# Patient Record
Sex: Female | Born: 1960 | Race: Black or African American | Hispanic: No | Marital: Married | Smoking: Never smoker
Health system: Southern US, Community
[De-identification: ages and names within clinical notes are randomized; demographics above are authoritative.]

## PROBLEM LIST (undated history)

## (undated) DIAGNOSIS — E041 Nontoxic single thyroid nodule: Secondary | ICD-10-CM

## (undated) DIAGNOSIS — N809 Endometriosis, unspecified: Secondary | ICD-10-CM

## (undated) DIAGNOSIS — H409 Unspecified glaucoma: Secondary | ICD-10-CM

## (undated) DIAGNOSIS — D219 Benign neoplasm of connective and other soft tissue, unspecified: Secondary | ICD-10-CM

## (undated) DIAGNOSIS — M545 Low back pain, unspecified: Secondary | ICD-10-CM

## (undated) DIAGNOSIS — D649 Anemia, unspecified: Secondary | ICD-10-CM

## (undated) DIAGNOSIS — E042 Nontoxic multinodular goiter: Principal | ICD-10-CM

## (undated) DIAGNOSIS — M479 Spondylosis, unspecified: Secondary | ICD-10-CM

## (undated) HISTORY — DX: Spondylosis, unspecified: M47.9

## (undated) HISTORY — DX: Low back pain, unspecified: M54.50

## (undated) HISTORY — DX: Endometriosis, unspecified: N80.9

## (undated) HISTORY — PX: BREAST SURGERY: SHX581

## (undated) HISTORY — DX: Unspecified glaucoma: H40.9

## (undated) HISTORY — DX: Anemia, unspecified: D64.9

## (undated) HISTORY — DX: Nontoxic single thyroid nodule: E04.1

## (undated) HISTORY — DX: Benign neoplasm of connective and other soft tissue, unspecified: D21.9

## (undated) HISTORY — DX: Low back pain: M54.5

## (undated) HISTORY — DX: Nontoxic multinodular goiter: E04.2

## (undated) HISTORY — PX: CARPAL TUNNEL RELEASE: SHX101

---

## 1991-07-16 HISTORY — PX: TUBAL LIGATION: SHX77

## 2006-12-15 ENCOUNTER — Other Ambulatory Visit: Admission: RE | Admit: 2006-12-15 | Discharge: 2006-12-15 | Payer: Self-pay | Admitting: Obstetrics and Gynecology

## 2007-12-29 ENCOUNTER — Other Ambulatory Visit: Admission: RE | Admit: 2007-12-29 | Discharge: 2007-12-29 | Payer: Self-pay | Admitting: Obstetrics & Gynecology

## 2008-02-13 HISTORY — PX: ROTATOR CUFF REPAIR: SHX139

## 2010-03-13 LAB — HM DEXA SCAN: HM DEXA SCAN: NORMAL

## 2011-01-07 ENCOUNTER — Other Ambulatory Visit: Payer: Self-pay | Admitting: Obstetrics and Gynecology

## 2011-01-07 DIAGNOSIS — E049 Nontoxic goiter, unspecified: Secondary | ICD-10-CM

## 2011-01-09 ENCOUNTER — Ambulatory Visit
Admission: RE | Admit: 2011-01-09 | Discharge: 2011-01-09 | Disposition: A | Discharge status: Home / Self Care | Payer: 59 | Source: Ambulatory Visit | Attending: Obstetrics and Gynecology | Admitting: Obstetrics and Gynecology

## 2011-01-09 DIAGNOSIS — E049 Nontoxic goiter, unspecified: Secondary | ICD-10-CM

## 2011-03-16 HISTORY — PX: ABDOMINAL HYSTERECTOMY: SHX81

## 2011-07-31 ENCOUNTER — Other Ambulatory Visit: Payer: Self-pay | Admitting: Endocrinology

## 2011-07-31 DIAGNOSIS — E042 Nontoxic multinodular goiter: Secondary | ICD-10-CM

## 2012-01-20 ENCOUNTER — Other Ambulatory Visit: Payer: 59

## 2012-01-27 ENCOUNTER — Other Ambulatory Visit: Payer: 59

## 2012-01-27 ENCOUNTER — Ambulatory Visit
Admission: RE | Admit: 2012-01-27 | Discharge: 2012-01-27 | Disposition: A | Discharge status: Home / Self Care | Payer: 59 | Source: Ambulatory Visit | Attending: Endocrinology | Admitting: Endocrinology

## 2012-01-27 DIAGNOSIS — E042 Nontoxic multinodular goiter: Secondary | ICD-10-CM

## 2012-06-10 ENCOUNTER — Ambulatory Visit: Payer: 59 | Admitting: Licensed Clinical Social Worker

## 2012-12-22 ENCOUNTER — Other Ambulatory Visit: Payer: Self-pay | Admitting: Endocrinology

## 2012-12-22 DIAGNOSIS — E041 Nontoxic single thyroid nodule: Secondary | ICD-10-CM

## 2013-01-07 ENCOUNTER — Encounter: Payer: Self-pay | Admitting: *Deleted

## 2013-01-11 ENCOUNTER — Ambulatory Visit (INDEPENDENT_AMBULATORY_CARE_PROVIDER_SITE_OTHER): Payer: 59 | Admitting: Certified Nurse Midwife

## 2013-01-11 ENCOUNTER — Encounter: Payer: Self-pay | Admitting: Certified Nurse Midwife

## 2013-01-11 VITALS — BP 110/70 | HR 64 | Resp 16 | Ht 65.75 in | Wt 200.0 lb

## 2013-01-11 DIAGNOSIS — Z0000 Encounter for general adult medical examination without abnormal findings: Secondary | ICD-10-CM

## 2013-01-11 DIAGNOSIS — Z01419 Encounter for gynecological examination (general) (routine) without abnormal findings: Secondary | ICD-10-CM

## 2013-01-11 LAB — POCT URINALYSIS DIPSTICK
Nitrite, UA: NEGATIVE
Protein, UA: NEGATIVE
Urobilinogen, UA: NEGATIVE

## 2013-01-11 NOTE — Progress Notes (Signed)
52 y.o. G3P3 Married African American Fe here for annual exam. Menopausal no HRT for the past month per PCP recommendation and patient choice. Patient diagnosed with arthritis in back after evaluation by Endocrine, has referral to specialist. Sees PCP for aex and labs " all normal" per patient. Endocrine still following thyroid nodule, has [place] this year. Not on any medication for thyroid. No gyn health issues today. Sister passed last year from breast cancer.  No other family history of breast cancer.Patient doing well emotionally after she passed.   Patient's last menstrual period was 03/16/2011.          Sexually active: yes  The current method of family planning is status post hysterectomy.    Exercising: yes  walking Smoker:  no  Health Maintenance: Pap: 01-07-11 neg MMG:  5/14 neg. Colonoscopy: 2006 neg BMD:   8/11 TDaP:  2009 Labs: Poct urine- rbc-trace Self breast exams: done occ   reports that she has never smoked. She does not have any smokeless tobacco history on file. She reports that she drinks about 0.5 ounces of alcohol per week. She reports that she does not use illicit drugs.  Past Medical History  Diagnosis Date  . Anemia   . Thyroid nodule   . Endometriosis   . Fibroid     Past Surgical History  Procedure Laterality Date  . Breast surgery      biopsy  . Carpal tunnel release      both hands  . Tubal ligation  1993  . Rotator cuff repair Right 8/09  . Abdominal hysterectomy  9/12    [facility]    Current Outpatient Prescriptions  Medication Sig Dispense Refill  . Ascorbic Acid (VITAMIN C PO) Take by mouth as needed.      Marland Kitchen aspirin 81 MG tablet Take 81 mg by mouth daily. Take 2 a day      . B Complex Vitamins (B COMPLEX PO) Take by mouth daily.      Marland Kitchen CALCIUM PO Take by mouth daily.      . Cetirizine-Pseudoephedrine (ZYRTEC-D PO) Take by mouth daily.      . Cholecalciferol (VITAMIN D PO) Take by mouth daily.      . Coenzyme Q10 (CO Q 10 PO) Take by mouth daily.       . fluticasone (FLONASE) 50 MCG/ACT nasal spray daily.      Marland Kitchen MAGNESIUM PO Take by mouth daily.      . Multiple Vitamins-Minerals (MULTIVITAMIN PO) Take by mouth daily.      . Omega-3 Fatty Acids (FISH OIL PO) Take by mouth.      . Probiotic Product (PROBIOTIC PO) Take by mouth daily.       No current facility-administered medications for this visit.    Family History  Problem Relation Age of Onset  . Heart disease Mother   . Hypertension Mother   . Diabetes Mother   . Cancer Father     lung  . Breast cancer Sister   . Heart disease Brother   . Diabetes Brother     ROS:  Pertinent items are noted in HPI.  Otherwise, a comprehensive ROS was negative.  Exam:   BP 110/70  Pulse 64  Resp 16  Ht 5' 5.75" (1.67 m)  Wt 200 lb (90.719 kg)  BMI 32.53 kg/m2  LMP 03/16/2011 Height: 5' 5.75" (167 cm)  Ht Readings from Last 3 Encounters:  01/11/13 5' 5.75" (1.67 m)    General appearance: alert,  cooperative and appears stated age Head: Normocephalic, without obvious abnormality, atraumatic Neck: no adenopathy, supple, symmetrical, trachea midline and thyroid  enlarged and no nodule palpated today Lungs: clear to auscultation bilaterally Breasts: normal appearance, no masses or tenderness, No nipple retraction or dimpling, No nipple discharge or bleeding, No axillary or supraclavicular adenopathy Heart: regular rate and rhythm Abdomen: soft, non-tender; no masses,  no organomegaly Extremities: extremities normal, atraumatic, no cyanosis or edema Skin: Skin color, texture, turgor normal. No rashes or lesions Lymph nodes: Cervical, supraclavicular, and axillary nodes normal. No abnormal inguinal nodes palpated Neurologic: Grossly normal   Pelvic: External genitalia:  no lesions              Urethra:  normal appearing urethra with no masses, tenderness or lesions              Bartholin's and Skene's: normal                 Vagina: normal appearing vagina with normal color and  discharge, no lesions              Cervix: absent              Pap taken: no Bimanual Exam:  Uterus:  uterus absent              Adnexa: no mass, fullness, tenderness and ovaries absent               Rectovaginal: Confirms               Anus:  normal sphincter tone, no lesions  A:  Well Woman with normal exam  S/P [facility] no HRT now patient choice and PCP recommended.  Known enlarged thyroid with evaluation, no size change, being followed by endocrine  Family history of breast cancer sister  Arthritis under evaluation  P:  Reviewed health and wellness pertinent to exam  Discussed may notice occasional hot flashes after stopping HRT, if concern notify.  Continue follow up with endocrine as indicated.  Stressed SBE, mammogram yearly   Pap smear as per guidelines   Mammogram yearly or as indicated pap smear not taken today  counseled on breast self exam, mammography screening, feminine hygiene, menopause, adequate intake of calcium and vitamin D, diet and exercise, Kegel's exercises  return annually or prn  An After Visit Summary was printed and given to the patient.

## 2013-01-11 NOTE — Patient Instructions (Addendum)

## 2013-01-12 NOTE — Progress Notes (Signed)
Reviewed CPRomine, MD 

## 2013-01-29 ENCOUNTER — Ambulatory Visit
Admission: RE | Admit: 2013-01-29 | Discharge: 2013-01-29 | Disposition: A | Discharge status: Home / Self Care | Payer: 59 | Source: Ambulatory Visit | Attending: Endocrinology | Admitting: Endocrinology

## 2013-01-29 DIAGNOSIS — E041 Nontoxic single thyroid nodule: Secondary | ICD-10-CM

## 2013-02-15 ENCOUNTER — Other Ambulatory Visit: Payer: 59

## 2013-03-08 ENCOUNTER — Encounter: Payer: Self-pay | Admitting: Neurology

## 2013-03-08 ENCOUNTER — Ambulatory Visit (INDEPENDENT_AMBULATORY_CARE_PROVIDER_SITE_OTHER): Payer: 59 | Admitting: Neurology

## 2013-03-08 VITALS — BP 141/89 | HR 67 | Ht 66.0 in | Wt 200.0 lb

## 2013-03-08 DIAGNOSIS — M545 Low back pain: Secondary | ICD-10-CM

## 2013-03-08 MED ORDER — MELOXICAM 15 MG PO TABS: 15.0000 mg | ORAL_TABLET | Freq: Every day | ORAL | Status: DC

## 2013-03-08 NOTE — Progress Notes (Signed)
Reason for visit: Low back pain  Courtney Roberson is a 52 y.o. female  History of present illness:  Courtney Roberson is a 52 year old right-handed black female with a history of low back pain that dates back approximately one year. The patient indicates that she has some pain in the back, more to the left side than the right. The pain will go down the legs to the feet at times, and the patient will also note some numbness that goes down to the feet as well. The patient does not have actual weakness of the legs. The patient denies any pain in the neck or shoulders. The patient has not had any balance issues, and she denies any falls. The patient denies problems controlling the bowels or the bladder. The patient indicates that if she is in one position too long such as sitting, standing, or lying down, she will have increased back pain. The patient feels better if she changes positions frequently. The patient has been trying to lose weight and be active, and she has noted that her back pain worsened when she was using the elliptical machine. The patient has undergone MRI evaluation of the low back that shows some facet joint arthritis at the L5-S1 level, but there is no evidence of nerve root compression at any level. The patient is sent to this office for further evaluation. The patient previously was placed on a prednisone taper, and she gained good improvement with the low back pain on this regimen.  Past Medical History  Diagnosis Date  . Anemia   . Thyroid nodule   . Endometriosis   . Fibroid   . Lumbago   . Glaucoma     bilateral    Past Surgical History  Procedure Laterality Date  . Breast surgery      biopsy benign  . Carpal tunnel release      both hands  . Tubal ligation  1993  . Rotator cuff repair Right 8/09  . Abdominal hysterectomy  9/12    [facility]    Family History  Problem Relation Age of Onset  . Heart disease Mother   . Hypertension Mother   . Diabetes Mother   . Cancer Father      lung  . Breast cancer Sister   . Heart disease Brother   . Diabetes Brother     Social history:  reports that she has never smoked. She does not have any smokeless tobacco history on file. She reports that she drinks about 0.5 ounces of alcohol per week. She reports that she does not use illicit drugs.  Medications:  Current Outpatient Prescriptions on File Prior to Visit  Medication Sig Dispense Refill  . aspirin 81 MG tablet Take 81 mg by mouth daily. Take 2 a day      . B Complex Vitamins (B COMPLEX PO) Take 1 tablet by mouth daily.       . fluticasone (FLONASE) 50 MCG/ACT nasal spray 2 sprays daily.       . Multiple Vitamins-Minerals (MULTIVITAMIN PO) Take by mouth daily.      . Probiotic Product (PROBIOTIC PO) Take by mouth daily.       No current facility-administered medications on file prior to visit.      Allergies  Allergen Reactions  . Betadine [Povidone Iodine] Swelling  . Penicillins Rash    ROS:  Out of a complete 14 system review of symptoms, the patient complains only of the following symptoms, and all other  reviewed systems are negative.  Weight gain, fatigue Chest pain, swelling in the legs Snoring Constipation Easy bruising Feeling hot, cold Joint pain, joint swelling Allergies, runny nose, skin sensitivity Memory loss Sleepiness, restless legs  Blood pressure 141/89, pulse 67, height 5\' 6"  (1.676 m), weight 200 lb (90.719 kg), last menstrual period 03/16/2011.  Physical Exam  General: The patient is alert and cooperative at the time of the examination. The patient is minimally obese.  Head: Pupils are equal, round, and reactive to light. Discs are flat bilaterally.  Neck: The neck is supple, no carotid bruits are noted.  Respiratory: The respiratory examination is clear.  Cardiovascular: The cardiovascular examination reveals a regular rate and rhythm, no obvious murmurs or rubs are noted.  Neuromuscular: The patient has excellent range  of motion of the low back. Minimal discomfort to palpation of the low back is noted.  Skin: Extremities are without significant edema.  Neurologic Exam  Mental status:  Cranial nerves: Facial symmetry is present. There is good sensation of the face to pinprick and soft touch bilaterally. The strength of the facial muscles and the muscles to head turning and shoulder shrug are normal bilaterally. Speech is well enunciated, no aphasia or dysarthria is noted. Extraocular movements are full. Visual fields are full.  Motor: The motor testing reveals 5 over 5 strength of all 4 extremities. Good symmetric motor tone is noted throughout. The patient is able to walk on heels and the toes.  Sensory: Sensory testing is intact to pinprick, soft touch, vibration sensation, and position sense on all 4 extremities. No evidence of extinction is noted.  Coordination: Cerebellar testing reveals good finger-nose-finger and heel-to-shin bilaterally.  Gait and station: Gait is normal. Tandem gait is normal. Romberg is negative. No drift is seen.  Reflexes: Deep tendon reflexes are symmetric and normal bilaterally, with the exception that the ankle jerk reflexes are depressed bilaterally. Toes are downgoing bilaterally.   Assessment/Plan:  1. Chronic low back pain  The patient has some facet joint arthritis that is the likely etiology for discomfort. The patient will be placed on Mobic, and she will continue exercising using yoga. The patient may consider chiropractic manipulations in the future. The patient will followup through this office in 4 or 5 months. The MRI of the lumbosacral spine does not show any surgical issues.  Courtney Palau MD 03/08/2013 7:26 PM  [facility] [address] [place], [place] [address]  Phone [phone] Fax [phone]

## 2013-03-26 ENCOUNTER — Telehealth: Payer: Self-pay

## 2013-03-26 LAB — FECAL OCCULT BLOOD, IMMUNOCHEMICAL: Fecal Occult Blood: NEGATIVE

## 2013-03-26 NOTE — Telephone Encounter (Signed)
IFOB neg LMTCB

## 2013-03-29 NOTE — Telephone Encounter (Signed)
Patient is returning Joy's call about results.

## 2013-03-29 NOTE — Telephone Encounter (Signed)
Patient notified of results.

## 2013-06-22 ENCOUNTER — Other Ambulatory Visit: Payer: Self-pay | Admitting: Endocrinology

## 2013-06-22 DIAGNOSIS — E041 Nontoxic single thyroid nodule: Secondary | ICD-10-CM

## 2013-07-09 ENCOUNTER — Encounter (INDEPENDENT_AMBULATORY_CARE_PROVIDER_SITE_OTHER): Payer: Self-pay

## 2013-07-09 ENCOUNTER — Encounter: Payer: Self-pay | Admitting: Nurse Practitioner

## 2013-07-09 ENCOUNTER — Ambulatory Visit (INDEPENDENT_AMBULATORY_CARE_PROVIDER_SITE_OTHER): Payer: 59 | Admitting: Nurse Practitioner

## 2013-07-09 VITALS — BP 126/85 | HR 68 | Ht 66.0 in | Wt 207.0 lb

## 2013-07-09 DIAGNOSIS — M545 Low back pain: Secondary | ICD-10-CM

## 2013-07-09 MED ORDER — MELOXICAM 15 MG PO TABS: 15.0000 mg | ORAL_TABLET | Freq: Every day | ORAL | Status: DC

## 2013-07-09 NOTE — Progress Notes (Signed)
I have read the note, and I agree with the clinical assessment and plan.  Chyane Greer KEITH   

## 2013-07-09 NOTE — Patient Instructions (Signed)
Given back exercises to perform several times daily Continue Yoga F/U in 6 months

## 2013-07-09 NOTE — Progress Notes (Signed)
[facility]  PATIENT: Courtney Roberson DOB: 1961-06-14   REASON FOR VISIT: Followup for low back pain   HISTORY OF PRESENT ILLNESS:Courtney Roberson, 52 -year-old female returns for followup. She continues to have intermittent low back pain. She is currently not taking Mobic, only taking it on a when necessary basis. She is continuing her yoga. Her MRI of the lumbar spine shows facet joint arthritis at L5-S1 but no nerve root compression. She is trying to lose weight and be active. She is not doing any specific back exercises.   HISTORY: of low back pain that dates back approximately one year. The patient indicates that she has some pain in the back, more to the left side than the right. The pain will go down the legs to the feet at times, and the patient will also note some numbness that goes down to the feet as well. The patient does not have actual weakness of the legs. The patient denies any pain in the neck or shoulders. The patient has not had any balance issues, and she denies any falls. The patient denies problems controlling the bowels or the bladder. The patient indicates that if she is in one position too long such as sitting, standing, or lying down, she will have increased back pain. The patient feels better if she changes positions frequently. The patient has been trying to lose weight and be active, and she has noted that her back pain worsened when she was using the elliptical machine. The patient has undergone MRI evaluation of the low back that shows some facet joint arthritis at the L5-S1 level, but there is no evidence of nerve root compression at any level. The patient is sent to this office for further evaluation. The patient previously was placed on a prednisone taper, and she gained good improvement with the low back pain on this regimen   REVIEW OF SYSTEMS: Full 14 system review of systems performed and notable only for those listed, all others are neg:    Constitutional: fatigue  Cardiovascular: N/A  Ear/Nose/Throat: N/A  Skin: N/A  Eyes: N/A  Respiratory: N/A  Gastroitestinal: N/A  Hematology/Lymphatic: N/A  Endocrine: N/A Musculoskeletal: Back pain, joint pain Allergy/Immunology: Environmental allergies  Neurological: N/A Psychiatric: N/A   ALLERGIES: Allergies  Allergen Reactions  . Betadine [Povidone Iodine] Swelling  . Penicillins Rash    HOME MEDICATIONS: Outpatient Prescriptions Prior to Visit  Medication Sig Dispense Refill  . aspirin 81 MG tablet Take 81 mg by mouth daily. Take 2 a day      . B Complex Vitamins (B COMPLEX PO) Take 1 tablet by mouth daily.       . Calcium Carbonate-Vitamin D (CALTRATE 600+D) 600-400 MG-UNIT per chew tablet Chew 1 tablet by mouth daily.      . cetirizine (ZYRTEC) 10 MG tablet Take 10 mg by mouth daily.      . cholecalciferol (VITAMIN D) 1000 UNITS tablet Take 1,000 Units by mouth daily.      . Coenzyme Q10 100 MG capsule Take 100 mg by mouth daily.      . dorzolamide-timolol (COSOPT) 22.3-6.8 MG/ML ophthalmic solution Place 1 drop into both eyes 2 (two) times daily.       . fluticasone (FLONASE) 50 MCG/ACT nasal spray 2 sprays daily.       . Magnesium Oxide 250 MG TABS Take 250 mg by mouth daily.      . meloxicam (MOBIC) 15 MG tablet Take 1 tablet (15 mg total)  by mouth daily.  30 tablet  2  . Multiple Vitamins-Minerals (MULTIVITAMIN PO) Take by mouth daily.      . Omega-3 Fatty Acids (FISH OIL) 1000 MG CAPS Take 1,000 mg by mouth daily.      . Probiotic Product (PROBIOTIC PO) Take by mouth daily.      . timolol (BETIMOL) 0.5 % ophthalmic solution Place 1 drop into both eyes daily.      . vitamin C (ASCORBIC ACID) 500 MG tablet Take 500 mg by mouth daily.       No facility-administered medications prior to visit.    PAST MEDICAL HISTORY: Past Medical History  Diagnosis Date  . Anemia   . Thyroid nodule   . Endometriosis   . Fibroid   . Lumbago   . Glaucoma     bilateral     PAST SURGICAL HISTORY: Past Surgical History  Procedure Laterality Date  . Breast surgery      biopsy benign  . Carpal tunnel release      both hands  . Tubal ligation  1993  . Rotator cuff repair Right 8/09  . Abdominal hysterectomy  9/12    [facility]    FAMILY HISTORY: Family History  Problem Relation Age of Onset  . Heart disease Mother   . Hypertension Mother   . Diabetes Mother   . Cancer Father     lung  . Breast cancer Sister   . Heart disease Brother   . Diabetes Brother     SOCIAL HISTORY: History   Social History  . Marital Status: Married    Spouse Name: N/A    Number of Children: 3  . Years of Education: 16   Occupational History  . Not on file.   Social History Main Topics  . Smoking status: Never Smoker   . Smokeless tobacco: Never Used  . Alcohol Use: 0.5 oz/week    1 drink(s) per week     Comment: occ.  . Drug Use: No  . Sexual Activity: Yes    Partners: Male    Birth Control/ Protection: Surgical     Comment: [facility]   Other Topics Concern  . Not on file   Social History Narrative   Patient is married Fayrene Fearing) and lives at home with her spouse.   Patient has three children.   Patient is retired.   Patient has an Associate's Degree.   Patient is right- handed.   Patient drinks tea- one cup a day, once a week for coffee and sodas once in a while.     PHYSICAL EXAM  Filed Vitals:   07/09/13 1413  BP: 126/85  Pulse: 68  Height: 5\' 6"  (1.676 m)  Weight: 207 lb (93.895 kg)   Body mass index is 33.43 kg/(m^2).  Generalized: Well developed, minimally obese female in no acute distress  Head: normocephalic and atraumatic,. Oropharynx benign  Neck: Supple, no carotid bruits  Cardiac: Regular rate rhythm, no murmur  Musculoskeletal: No deformity   Neurological examination   Mentation: Alert oriented to time, place, history taking. Follows all commands speech and language fluent  Cranial nerve II-XII: .Pupils were equal round reactive  to light extraocular movements were full, visual field were full on confrontational test. Facial sensation and strength were normal. hearing was intact to finger rubbing bilaterally. Uvula tongue midline. head turning and shoulder shrug were normal and symmetric.Tongue protrusion into cheek strength was normal. Motor: normal bulk and tone, full strength in the BUE, BLE, fine  finger movements normal, no pronator drift. No focal weakness Sensory: normal and symmetric to light touch, pinprick, and  vibration  Coordination: finger-nose-finger, heel-to-shin bilaterally, no dysmetria Reflexes: Brachioradialis 2/2, biceps 2/2, triceps 2/2, patellar 2/2, Achilles 1/1, plantar responses were flexor bilaterally. Gait and Station: Rising up from seated position without assistance, normal stance,  moderate stride, good arm swing, smooth turning, able to perform tiptoe, and heel walking without difficulty. Tandem gait is steady  DIAGNOSTIC DATA (LABS, IMAGING, TESTING) -None to review  ASSESSMENT AND PLAN  52 y.o. year old female  has a past medical history of Anemia;  Lumbago; to followup. She is only taking her Mobic when necessary  Given back exercises to perform several times daily Continue Yoga Mobic daily as presacribed F/U in 6 months Nilda Riggs, [facility], [facility], APRN  [facility] [address] [place], [place] [address] [phone]

## 2013-12-01 LAB — LIPID PANEL
CHOLESTEROL: 195 mg/dL (ref 0–200)
HDL: 74 mg/dL — AB (ref 35–70)
LDL Cholesterol: 118 mg/dL
LDl/HDL Ratio: 2.6
Triglycerides: 69 mg/dL (ref 40–160)

## 2013-12-01 LAB — BASIC METABOLIC PANEL
BUN: 13 mg/dL (ref 4–21)
CREATININE: 1 mg/dL (ref <=1.1)
GLUCOSE: 95 mg/dL
POTASSIUM: 5.2 mmol/L (ref 3.4–5.3)
Sodium: 142 mmol/L (ref 137–147)

## 2013-12-01 LAB — CBC AND DIFFERENTIAL
HCT: 38 % (ref 36–46)
HEMOGLOBIN: 12.4 g/dL (ref 12.0–16.0)
Platelets: 246 10*3/uL (ref 150–399)
WBC: 5.7 10*3/mL

## 2013-12-01 LAB — HEPATIC FUNCTION PANEL
ALK PHOS: 69 U/L (ref 25–125)
ALT: 13 U/L (ref 7–35)
AST: 18 U/L (ref 13–35)
Bilirubin, Total: 0.9 mg/dL

## 2013-12-20 ENCOUNTER — Ambulatory Visit: Payer: 59 | Admitting: Nurse Practitioner

## 2014-01-12 ENCOUNTER — Encounter: Payer: Self-pay | Admitting: Certified Nurse Midwife

## 2014-01-12 ENCOUNTER — Ambulatory Visit (INDEPENDENT_AMBULATORY_CARE_PROVIDER_SITE_OTHER): Payer: 59 | Admitting: Certified Nurse Midwife

## 2014-01-12 VITALS — BP 110/64 | HR 68 | Resp 16 | Ht 65.75 in | Wt 202.0 lb

## 2014-01-12 DIAGNOSIS — Z0000 Encounter for general adult medical examination without abnormal findings: Secondary | ICD-10-CM

## 2014-01-12 DIAGNOSIS — Z1211 Encounter for screening for malignant neoplasm of colon: Secondary | ICD-10-CM

## 2014-01-12 DIAGNOSIS — Z01419 Encounter for gynecological examination (general) (routine) without abnormal findings: Secondary | ICD-10-CM

## 2014-01-12 LAB — POCT URINALYSIS DIPSTICK
BILIRUBIN UA: NEGATIVE
Glucose, UA: NEGATIVE
KETONES UA: NEGATIVE
LEUKOCYTES UA: NEGATIVE
Nitrite, UA: NEGATIVE
PH UA: 5
PROTEIN UA: NEGATIVE
Urobilinogen, UA: NEGATIVE

## 2014-01-12 NOTE — Patient Instructions (Signed)

## 2014-01-12 NOTE — Progress Notes (Signed)
53 y.o. G3P3 Married African American Fe here for annual exam. Menopausal no HRT, just occasional hot flashes. No vaginal dryness issues. Sees PCP for aex, labs and medication management.Has appointment in July for follow up of thyroid nodule, no change with last [place]. Staying busy with exercise and family. No other health issues today.   Patient's last menstrual period was 03/16/2011.          Sexually active: Yes.    The current method of family planning is status post hysterectomy.    Exercising: Yes.    yoga, low impact walking Smoker:  no  Health Maintenance: Pap:  01-07-11 neg MMG: 11-26-13 density category c, bi-rads category 2: benign Colonoscopy:  2006 BMD:   8/11 TDaP:  2009 Labs: Poct urine-rbc tr Self breast exam: done occ   reports that she has never smoked. She has never used smokeless tobacco. She reports that she drinks alcohol. She reports that she does not use illicit drugs.  Past Medical History  Diagnosis Date  . Anemia   . Thyroid nodule   . Endometriosis   . Fibroid   . Lumbago   . Glaucoma     bilateral  . Arthritis of back     Past Surgical History  Procedure Laterality Date  . Breast surgery      biopsy benign  . Carpal tunnel release      both hands  . Tubal ligation  1993  . Rotator cuff repair Right 8/09  . Abdominal hysterectomy  9/12    [facility]    Current Outpatient Prescriptions  Medication Sig Dispense Refill  . aspirin 81 MG tablet Take 81 mg by mouth daily. Take 2 a day      . B Complex Vitamins (B COMPLEX PO) Take 1 tablet by mouth daily.       . Calcium Carbonate-Vitamin D (CALTRATE 600+D) 600-400 MG-UNIT per chew tablet Chew 1 tablet by mouth daily.      . cetirizine (ZYRTEC) 10 MG tablet Take 10 mg by mouth daily.      . cholecalciferol (VITAMIN D) 1000 UNITS tablet Take 1,000 Units by mouth daily.      . Coenzyme Q10 100 MG capsule Take 100 mg by mouth daily.      . dorzolamide-timolol (COSOPT) 22.3-6.8 MG/ML ophthalmic solution Place  1 drop into both eyes 2 (two) times daily.       . fluticasone (FLONASE) 50 MCG/ACT nasal spray 2 sprays daily.       . Magnesium Oxide 250 MG TABS Take 250 mg by mouth daily.      . Multiple Vitamins-Minerals (MULTIVITAMIN PO) Take by mouth daily.      . Omega-3 Fatty Acids (FISH OIL) 1000 MG CAPS Take 1,000 mg by mouth daily.      . Probiotic Product (PROBIOTIC PO) Take by mouth as needed.       . timolol (BETIMOL) 0.5 % ophthalmic solution Place 1 drop into both eyes daily.      . vitamin C (ASCORBIC ACID) 500 MG tablet Take 500 mg by mouth as needed.        No current facility-administered medications for this visit.    Family History  Problem Relation Age of Onset  . Heart disease Mother   . Hypertension Mother   . Diabetes Mother   . Cancer Father     lung  . Breast cancer Sister   . Heart disease Brother   . Diabetes Brother  ROS:  Pertinent items are noted in HPI.  Otherwise, a comprehensive ROS was negative.  Exam:   BP 110/64  Pulse 68  Resp 16  Ht 5' 5.75" (1.67 m)  Wt 202 lb (91.627 kg)  BMI 32.85 kg/m2  LMP 03/16/2011 Height: 5' 5.75" (167 cm)  Ht Readings from Last 3 Encounters:  01/12/14 5' 5.75" (1.67 m)  07/09/13 5\' 6"  (1.676 m)  03/08/13 5\' 6"  (1.676 m)    General appearance: alert, cooperative and appears stated age Head: Normocephalic, without obvious abnormality, atraumatic Neck: no adenopathy, supple, symmetrical, trachea midline and thyroidenlarged to inspection and palpation, nodular and known nodules Lungs: clear to auscultation bilaterally Breasts: normal appearance, no masses or tenderness, No nipple retraction or dimpling, No nipple discharge or bleeding, No axillary or supraclavicular adenopathy Heart: regular rate and rhythm Abdomen: soft, non-tender; no masses,  no organomegaly Extremities: extremities normal, atraumatic, no cyanosis or edema Skin: Skin color, texture, turgor normal. No rashes or lesions Lymph nodes: Cervical,  supraclavicular, and axillary nodes normal. No abnormal inguinal nodes palpated Neurologic: Grossly normal   Pelvic: External genitalia:  no lesions              Urethra:  normal appearing urethra with no masses, tenderness or lesions              Bartholin's and Skene's: normal                 Vagina: normal appearing vagina with normal color and discharge, no lesions              Cervix: absent              Pap taken: No. Bimanual Exam:  Uterus:  uterus absent              Adnexa: no mass, fullness, tenderness               Rectovaginal: Confirms               Anus:  normal sphincter tone, no lesions  A:  Well Woman with normal exam  Menopausal no HRT, [facility] endometrosis  Enlarged thyroid with nodules under Endocrine management  History of microscopic hematuria with negative urology work up faint trace of RBC, no micro sent    P:   Reviewed health and wellness pertinent to exam  Continue follow up as indicated with MD  Pap smear not taken today  counseled on breast self exam, mammography screening, adequate intake of calcium and vitamin D, diet and exercise. IFOB dispensed  return annually or prn  An After Visit Summary was printed and given to the patient.

## 2014-01-17 NOTE — Progress Notes (Signed)
Reviewed personally.  M. Suzanne Velencia Lenart, MD.  

## 2014-01-24 ENCOUNTER — Ambulatory Visit
Admission: RE | Admit: 2014-01-24 | Discharge: 2014-01-24 | Disposition: A | Discharge status: Home / Self Care | Payer: 59 | Source: Ambulatory Visit | Attending: Endocrinology | Admitting: Endocrinology

## 2014-01-24 DIAGNOSIS — E041 Nontoxic single thyroid nodule: Secondary | ICD-10-CM

## 2014-02-07 ENCOUNTER — Telehealth: Payer: Self-pay | Admitting: *Deleted

## 2014-02-07 NOTE — Telephone Encounter (Signed)
Patient called back and was r/s to 05/10/14 at 11:30 am.

## 2014-02-07 NOTE — Telephone Encounter (Signed)
Called patient on both home and mobile phone, lvm on mobile phone to return the call to r/s appointment.

## 2014-02-10 ENCOUNTER — Ambulatory Visit: Payer: Self-pay | Admitting: Nurse Practitioner

## 2014-02-14 ENCOUNTER — Other Ambulatory Visit: Payer: Self-pay | Admitting: Endocrinology

## 2014-02-14 DIAGNOSIS — E041 Nontoxic single thyroid nodule: Secondary | ICD-10-CM

## 2014-02-16 ENCOUNTER — Ambulatory Visit: Payer: Self-pay | Admitting: Nurse Practitioner

## 2014-02-25 LAB — FECAL OCCULT BLOOD, IMMUNOCHEMICAL: IMMUNOLOGICAL FECAL OCCULT BLOOD TEST: NEGATIVE

## 2014-02-25 NOTE — Addendum Note (Signed)
Addended by: Graylon Good on: 02/25/2014 10:32 AM   Modules accepted: Orders

## 2014-04-18 ENCOUNTER — Telehealth: Payer: Self-pay | Admitting: *Deleted

## 2014-04-18 NOTE — Telephone Encounter (Signed)
Calling patient to r/s appointment on 05/10/14, left message to call back and r/s appointment.

## 2014-05-10 ENCOUNTER — Ambulatory Visit: Payer: Self-pay | Admitting: Nurse Practitioner

## 2014-05-16 ENCOUNTER — Encounter: Payer: Self-pay | Admitting: Certified Nurse Midwife

## 2014-05-17 ENCOUNTER — Encounter: Payer: Self-pay | Admitting: Nurse Practitioner

## 2014-05-17 ENCOUNTER — Ambulatory Visit (INDEPENDENT_AMBULATORY_CARE_PROVIDER_SITE_OTHER): Payer: 59 | Admitting: Nurse Practitioner

## 2014-05-17 VITALS — BP 120/75 | HR 65 | Ht 66.0 in | Wt 208.0 lb

## 2014-05-17 DIAGNOSIS — M545 Low back pain: Secondary | ICD-10-CM

## 2014-05-17 NOTE — Progress Notes (Signed)
I have read the note, and I agree with the clinical assessment and plan.  Shine Scrogham KEITH   

## 2014-05-17 NOTE — Patient Instructions (Addendum)
Continue  back exercises to perform several times daily Continue Yoga Water aerobics  F/U in 6 months

## 2014-05-17 NOTE — Progress Notes (Signed)
[facility]  PATIENT: Courtney Roberson DOB: 09-06-1960   REASON FOR VISIT: follow up for back pain   HISTORY OF PRESENT ILLNESS:Courtney Roberson, 53 -year-old female returns for followup. She continues to have intermittent low back pain. She is currently  taking Aleve,  only taking it on a when necessary basis for back pain.  She is continuing her yoga. Her MRI of the lumbar spine shows facet joint arthritis at L5-S1 but no nerve root compression. She is trying to lose weight and be active. She is  doing  specific back exercises.She feels like she is stable. She returns for reevaluation   HISTORY: of low back pain that dates back approximately one year. The patient indicates that she has some pain in the back, more to the left side than the right. The pain will go down the legs to the feet at times, and the patient will also note some numbness that goes down to the feet as well. The patient does not have actual weakness of the legs. The patient denies any pain in the neck or shoulders. The patient has not had any balance issues, and she denies any falls. The patient denies problems controlling the bowels or the bladder. The patient indicates that if she is in one position too long such as sitting, standing, or lying down, she will have increased back pain. The patient feels better if she changes positions frequently. The patient has been trying to lose weight and be active, and she has noted that her back pain worsened when she was using the elliptical machine. The patient has undergone MRI evaluation of the low back that shows some facet joint arthritis at the L5-S1 level, but there is no evidence of nerve root compression at any level. The patient is sent to this office for further evaluation. The patient previously was placed on a prednisone taper, and she gained good improvement with the low back pain on this regimen   REVIEW OF SYSTEMS: Full 14 system review of systems performed and  notable only for those listed, all others are neg:  Constitutional: N/A  Cardiovascular: N/A  Ear/Nose/Throat: N/A  Skin: N/A  Eyes: light sensitivity Respiratory: N/A  Gastroitestinal: N/A  Hematology/Lymphatic: N/A  Endocrine: N/A Musculoskeletal:joint pain, Occasional back pain Allergy/Immunology: environmental allergies  Neurological: N/A Psychiatric: N/A Sleep : NA   ALLERGIES: Allergies  Allergen Reactions  . Betadine [Povidone Iodine] Swelling  . Penicillins Rash    HOME MEDICATIONS: Outpatient Prescriptions Prior to Visit  Medication Sig Dispense Refill  . aspirin 81 MG tablet Take 81 mg by mouth daily. Take 2 a day    . B Complex Vitamins (B COMPLEX PO) Take 1 tablet by mouth daily.     . Calcium Carbonate-Vitamin D (CALTRATE 600+D) 600-400 MG-UNIT per chew tablet Chew 1 tablet by mouth daily.    . cetirizine (ZYRTEC) 10 MG tablet Take 10 mg by mouth daily.    . cholecalciferol (VITAMIN D) 1000 UNITS tablet Take 1,000 Units by mouth daily.    . Coenzyme Q10 100 MG capsule Take 100 mg by mouth daily.    . dorzolamide-timolol (COSOPT) 22.3-6.8 MG/ML ophthalmic solution Place 1 drop into both eyes 2 (two) times daily.     . fluticasone (FLONASE) 50 MCG/ACT nasal spray 2 sprays daily.     . Magnesium Oxide 250 MG TABS Take 250 mg by mouth daily.    . Multiple Vitamins-Minerals (MULTIVITAMIN PO) Take by mouth daily.    . Omega-3  Fatty Acids (FISH OIL) 1000 MG CAPS Take 1,000 mg by mouth daily.    . Probiotic Product (PROBIOTIC PO) Take by mouth as needed.     . timolol (BETIMOL) 0.5 % ophthalmic solution Place 1 drop into both eyes daily.    . vitamin C (ASCORBIC ACID) 500 MG tablet Take 500 mg by mouth as needed.      No facility-administered medications prior to visit.    PAST MEDICAL HISTORY: Past Medical History  Diagnosis Date  . Anemia   . Thyroid nodule   . Endometriosis   . Fibroid   . Lumbago   . Glaucoma     bilateral  . Arthritis of back      PAST SURGICAL HISTORY: Past Surgical History  Procedure Laterality Date  . Breast surgery      biopsy benign  . Carpal tunnel release      both hands  . Tubal ligation  1993  . Rotator cuff repair Right 8/09  . Abdominal hysterectomy  9/12    [facility]    FAMILY HISTORY: Family History  Problem Relation Age of Onset  . Heart disease Mother   . Hypertension Mother   . Diabetes Mother   . Cancer Father     lung  . Breast cancer Sister   . Heart disease Brother   . Diabetes Brother     SOCIAL HISTORY: History   Social History  . Marital Status: Married    Spouse Name: Jeneen Rinks     Number of Children: 3  . Years of Education: 16   Occupational History  . Not on file.   Social History Main Topics  . Smoking status: Never Smoker   . Smokeless tobacco: Never Used  . Alcohol Use: Yes  . Drug Use: No  . Sexual Activity:    Partners: Male    Birth Control/ Protection: Surgical     Comment: [facility]   Other Topics Concern  . Not on file   Social History Narrative   Patient is married Jeneen Rinks) and lives at home with her spouse.   Patient has three children.   Patient is retired.   Patient has an Associate's Degree.   Patient is right- handed.   Patient drinks tea- one cup a day, once a week for coffee and sodas once in a while.     PHYSICAL EXAM  Filed Vitals:   05/17/14 1515  BP: 120/75  Pulse: 65  Height: 5\' 6"  (1.676 m)  Weight: 208 lb (94.348 kg)   Body mass index is 33.59 kg/(m^2). Generalized: Well developed, minimally obese female in no acute distress  Head: normocephalic and atraumatic,. Oropharynx benign  Neck: Supple, no carotid bruits  Cardiac: Regular rate rhythm, no murmur  Musculoskeletal: No deformity   Neurological examination   Mentation: Alert oriented to time, place, history taking. Follows all commands speech and language fluent  Cranial nerve II-XII: .Pupils were equal round reactive to light extraocular movements were full, visual  field were full on confrontational test. Facial sensation and strength were normal. hearing was intact to finger rubbing bilaterally. Uvula tongue midline. head turning and shoulder shrug were normal and symmetric.Tongue protrusion into cheek strength was normal. Motor: normal bulk and tone, full strength in the BUE, BLE, fine finger movements normal, no pronator drift. No focal weakness Sensory: normal and symmetric to light touch, pinprick, and vibration  Coordination: finger-nose-finger, heel-to-shin bilaterally, no dysmetria Reflexes: Brachioradialis 2/2, biceps 2/2, triceps 2/2, patellar 2/2, Achilles 1/1, plantar  responses were flexor bilaterally. Gait and Station: Rising up from seated position without assistance, normal stance, moderate stride, good arm swing, smooth turning, able to perform tiptoe, and heel walking without difficulty. Tandem gait is steady  DIAGNOSTIC DATA (LABS, IMAGING, TESTING) - ASSESSMENT AND PLAN  53 y.o. year old female  has a past medical history of  Lumbago; and Arthritis of back. here to follow up. Her symptoms are stable.  Continue back exercises to perform several times daily Continue Yoga Water aerobics  F/U in 6 months Dennie Bible, [facility], [facility], APRN  [facility] [address], [place], [place] [address] [phone]

## 2014-05-24 NOTE — Telephone Encounter (Signed)
Patient was seen 05/17/14

## 2014-06-29 IMAGING — US US SOFT TISSUE HEAD/NECK
1 series · 14 of 25 positions shown · non-contrast
Comparison: Ultrasound of the thyroid of 01/09/2011

CLINICAL DATA: Multinodular goiter, follow-up

THYROID ULTRASOUND
TECHNIQUE: Ultrasound examination of the thyroid gland and adjacent
soft tissues was performed.

[Series 1: us soft tissue head/neck · 0.05mm/px · 14 of 57 slices shown]
[im 1/57]
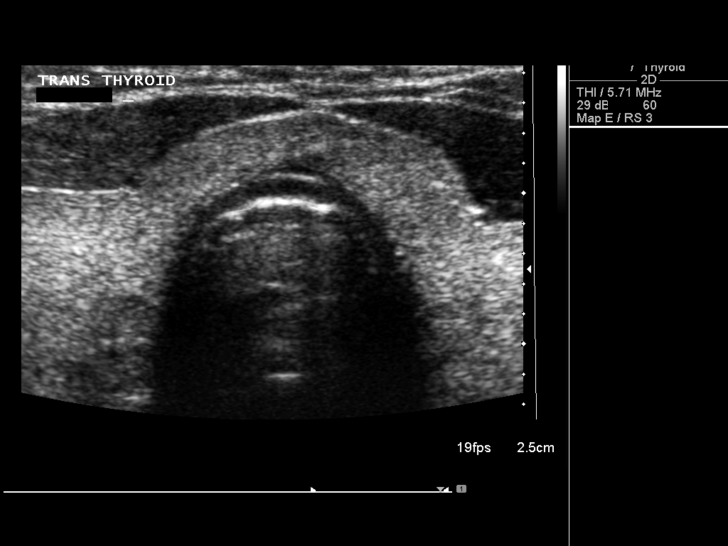
[im 5/57]
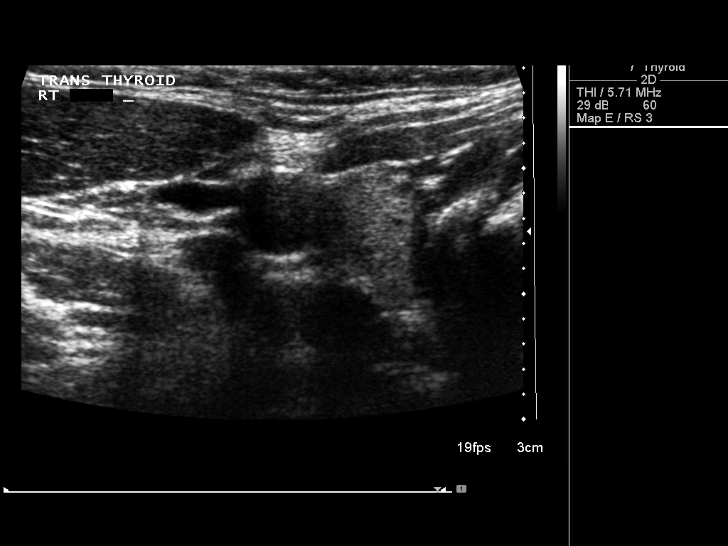
[im 10/57]
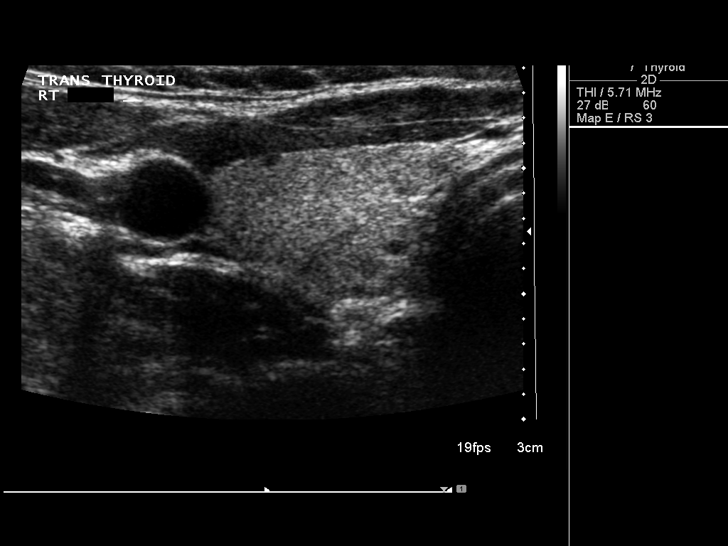
[im 15/57]
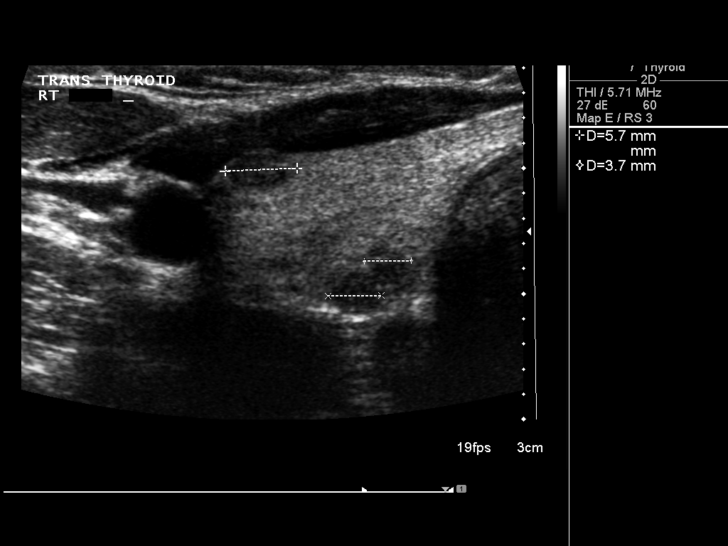
[im 19/57]
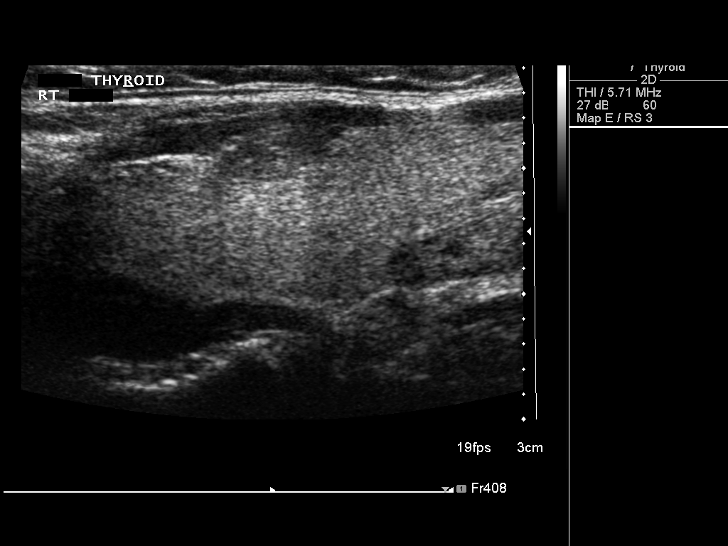
[im 22/57]
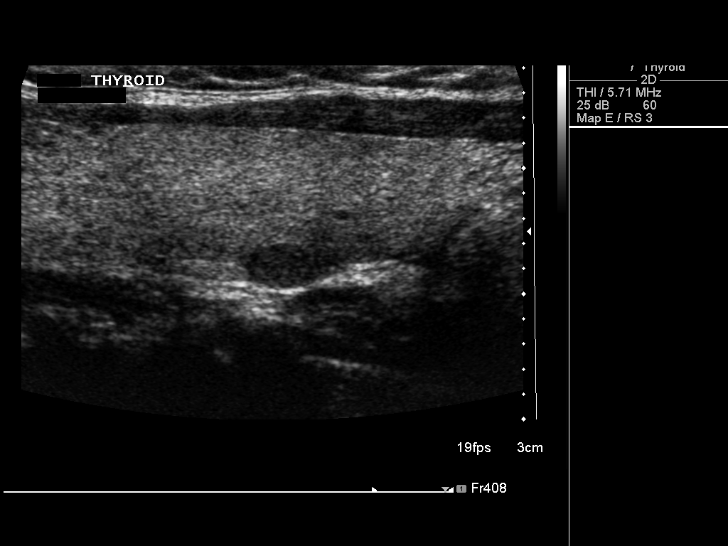
[im 26/57]
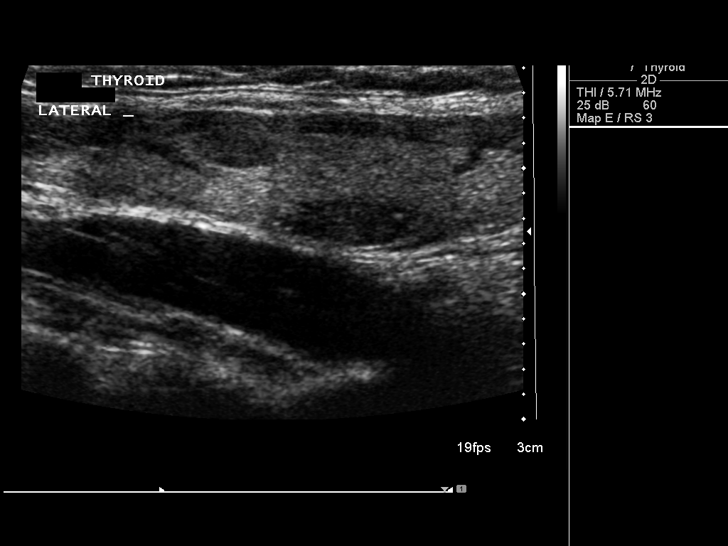
[im 31/57]
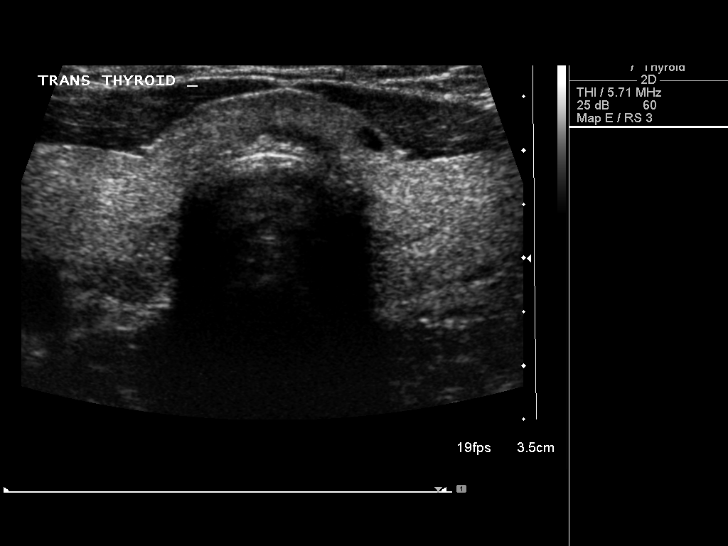
[im 36/57]
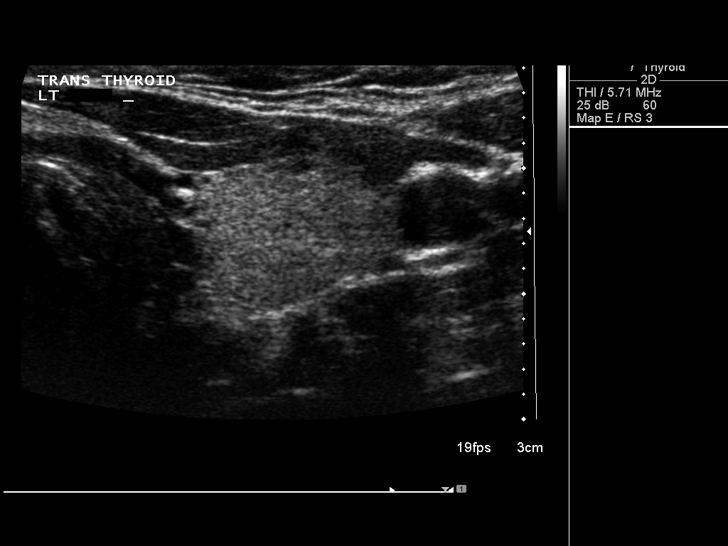
[im 38/57]
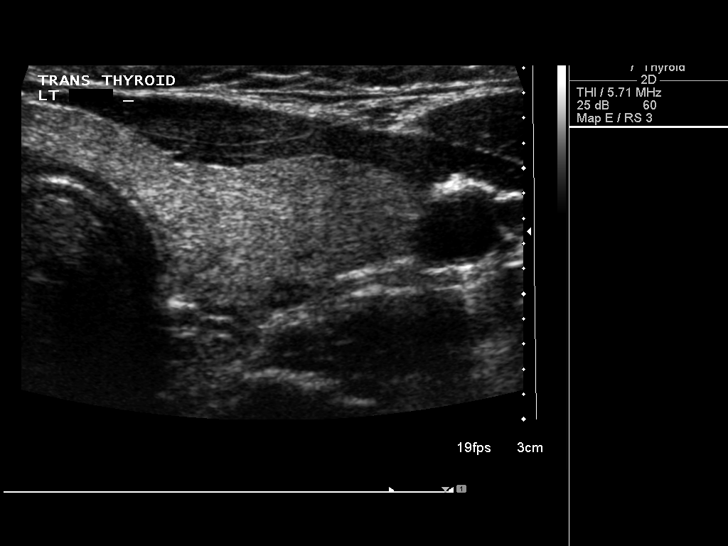
[im 43/57]
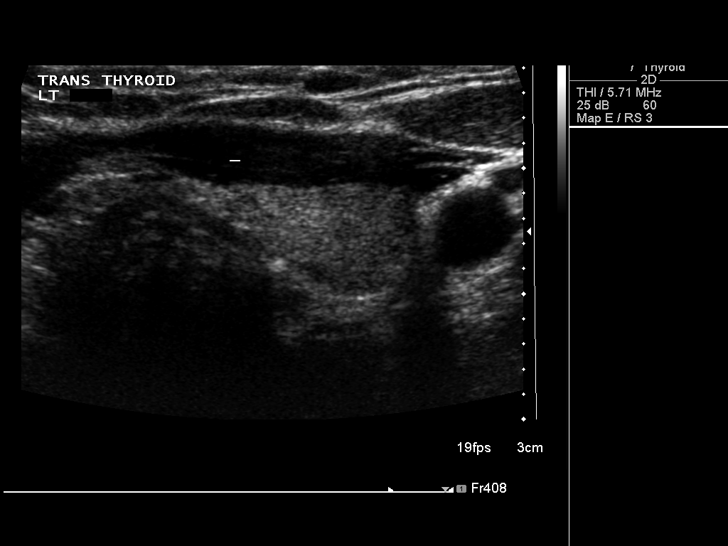
[im 47/57]
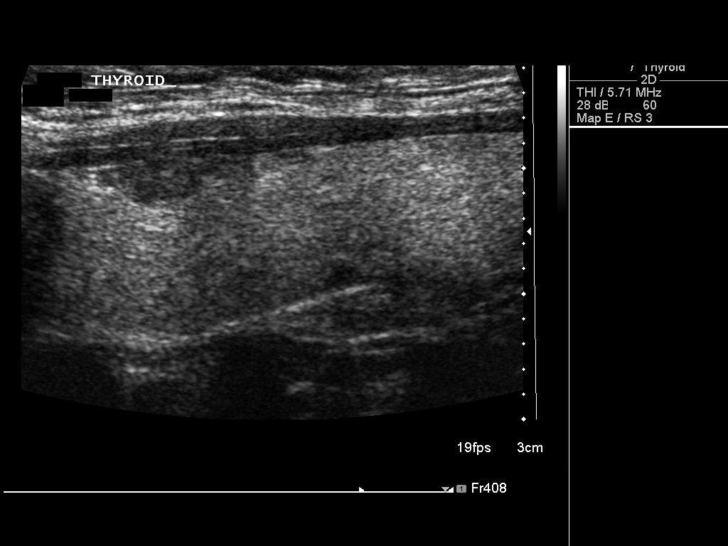
[im 52/57]
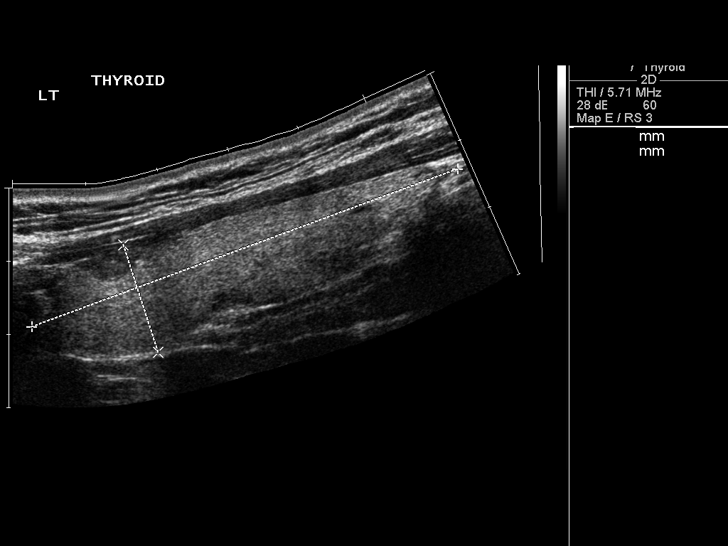
[im 57/57]
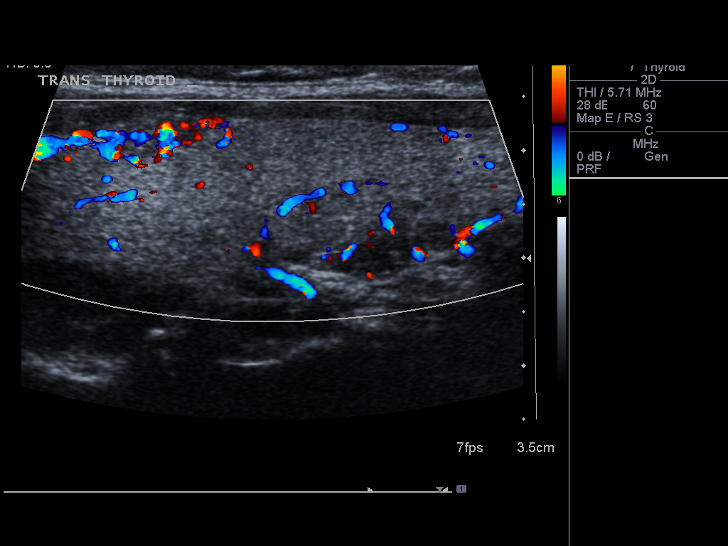

[14 of 25 positions shown; findings below may reference images not displayed]

FINDINGS: Right thyroid lobe:  6.3 x 1.3 x 1.8 cm.  (Previously 6.1 x 1.4 x
1.8 cm).
Left thyroid lobe:  6.2 x 1.5 x 2.2 cm.  (Previously 6.6 x 1.6 x
2.1 cm)
Isthmus:   4.4 mm in thickness.

Focal nodules:  The echogenicity of the thyroid gland is
homogeneous.  However there are multiple hypoechoic nodules
bilaterally of 10 mm or less in size.  No enlarging nodule is seen.

Lymphadenopathy:  None visualized.
IMPRESSION: Stable small hypoechoic thyroid nodules of no more than 10 mm in
diameter.

## 2014-08-17 ENCOUNTER — Encounter (HOSPITAL_COMMUNITY): Payer: Self-pay | Admitting: Emergency Medicine

## 2014-08-17 ENCOUNTER — Emergency Department (HOSPITAL_COMMUNITY): Payer: 59

## 2014-08-17 ENCOUNTER — Emergency Department (HOSPITAL_COMMUNITY)
Admission: EM | Admit: 2014-08-17 | Discharge: 2014-08-17 | Disposition: A | Discharge status: Home / Self Care | Payer: 59 | Attending: Emergency Medicine | Admitting: Emergency Medicine

## 2014-08-17 DIAGNOSIS — S199XXA Unspecified injury of neck, initial encounter: Secondary | ICD-10-CM | POA: Diagnosis present

## 2014-08-17 DIAGNOSIS — Z8639 Personal history of other endocrine, nutritional and metabolic disease: Secondary | ICD-10-CM | POA: Diagnosis not present

## 2014-08-17 DIAGNOSIS — Z862 Personal history of diseases of the blood and blood-forming organs and certain disorders involving the immune mechanism: Secondary | ICD-10-CM | POA: Diagnosis not present

## 2014-08-17 DIAGNOSIS — S3992XA Unspecified injury of lower back, initial encounter: Secondary | ICD-10-CM | POA: Diagnosis not present

## 2014-08-17 DIAGNOSIS — Y998 Other external cause status: Secondary | ICD-10-CM | POA: Diagnosis not present

## 2014-08-17 DIAGNOSIS — Z880 Allergy status to penicillin: Secondary | ICD-10-CM | POA: Diagnosis not present

## 2014-08-17 DIAGNOSIS — Y9389 Activity, other specified: Secondary | ICD-10-CM | POA: Insufficient documentation

## 2014-08-17 DIAGNOSIS — Z7951 Long term (current) use of inhaled steroids: Secondary | ICD-10-CM | POA: Diagnosis not present

## 2014-08-17 DIAGNOSIS — Z79899 Other long term (current) drug therapy: Secondary | ICD-10-CM | POA: Diagnosis not present

## 2014-08-17 DIAGNOSIS — S0990XA Unspecified injury of head, initial encounter: Secondary | ICD-10-CM | POA: Diagnosis not present

## 2014-08-17 DIAGNOSIS — M1990 Unspecified osteoarthritis, unspecified site: Secondary | ICD-10-CM | POA: Diagnosis not present

## 2014-08-17 DIAGNOSIS — H409 Unspecified glaucoma: Secondary | ICD-10-CM | POA: Insufficient documentation

## 2014-08-17 DIAGNOSIS — Z7982 Long term (current) use of aspirin: Secondary | ICD-10-CM | POA: Insufficient documentation

## 2014-08-17 DIAGNOSIS — Y92003 Bedroom of unspecified non-institutional (private) residence as the place of occurrence of the external cause: Secondary | ICD-10-CM | POA: Insufficient documentation

## 2014-08-17 DIAGNOSIS — T1490XA Injury, unspecified, initial encounter: Secondary | ICD-10-CM

## 2014-08-17 MED ORDER — NAPROXEN 250 MG PO TABS
500.0000 mg | ORAL_TABLET | Freq: Once | ORAL | Status: AC
  Administered 2014-08-17: 500 mg via ORAL
  Filled 2014-08-17: qty 2

## 2014-08-17 MED ORDER — NAPROXEN 500 MG PO TABS: 500.0000 mg | ORAL_TABLET | Freq: Two times a day (BID) | ORAL | Status: DC

## 2014-08-17 MED ORDER — HYDROCODONE-ACETAMINOPHEN 5-325 MG PO TABS: 1.0000 | ORAL_TABLET | ORAL | Status: DC | PRN
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
## 2014-08-17 NOTE — Discharge Instructions (Signed)
Please follow the directions provided. Be sure to follow-up with your primary care doctor to ensure you're getting better. Take the naproxen twice a day to help with pain and inflammation. You may take the Vicodin for pain not relieved by the naproxen. Don't hesitate to return for any new, worsening, or concerning symptoms.  SEEK MEDICAL CARE IF:  You have increasing pain or swelling in the injured area.  You have numbness, tingling, or a significant loss of strength in the injured area.

## 2014-08-17 NOTE — ED Notes (Signed)
Soft collar given to pt. Pt given instructions to on how to wear it. Pt given discharge instructions. Pt discharged home with daughter a safe place. Pt understood instructions and given information from [place].
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
## 2014-08-17 NOTE — ED Notes (Signed)
Sherrif at bedside

## 2014-08-17 NOTE — ED Notes (Signed)
Patient transported to X-ray 

## 2014-10-24 ENCOUNTER — Telehealth: Payer: Self-pay

## 2014-10-24 NOTE — Telephone Encounter (Addendum)
Spoke to patient r/s appt from evening to a.m on 05/04.

## 2014-10-27 ENCOUNTER — Encounter: Payer: Self-pay | Admitting: Certified Nurse Midwife

## 2014-10-27 ENCOUNTER — Ambulatory Visit (INDEPENDENT_AMBULATORY_CARE_PROVIDER_SITE_OTHER): Payer: 59 | Admitting: Certified Nurse Midwife

## 2014-10-27 VITALS — BP 102/70 | HR 70 | Resp 16 | Ht 65.75 in | Wt 190.0 lb

## 2014-10-27 DIAGNOSIS — B3731 Acute candidiasis of vulva and vagina: Secondary | ICD-10-CM

## 2014-10-27 DIAGNOSIS — B373 Candidiasis of vulva and vagina: Secondary | ICD-10-CM

## 2014-10-27 DIAGNOSIS — N952 Postmenopausal atrophic vaginitis: Secondary | ICD-10-CM | POA: Diagnosis not present

## 2014-10-27 MED ORDER — NYSTATIN-TRIAMCINOLONE 100000-0.1 UNIT/GM-% EX CREA: 1.0000 "application " | TOPICAL_CREAM | Freq: Every day | CUTANEOUS | Status: DC

## 2014-10-27 NOTE — Progress Notes (Signed)
54 y.o.Married menopausal african [place] female g3p3 here with complaint of vaginal symptoms of burning and pain with sexually activity. Describes discharge as slight, clear, no odor.. Onset of symptoms 14 days ago. Denies new personal products. Patient does use Summer Eve products for cleaning and using the spray. Cleans with 2-3 times daily and uses spray also. Patient also relates that she is not holding urine as long and now if over full will have some loss of urine. No STD concerns. Urinary symptoms  Essentially none. Marland Kitchen    O:Healthy female WDWN Affect: normal, orientation x 3  Exam:Skin warm and dry CVAT negative bilateral Abdomen: soft, non tender, negative suprapubic Lymph node: no enlargement or tenderness Pelvic exam: External genital: normal female with slight external increase pink with scaling noted with exudate wet prep taken, urethra, urethral meatus and bladder non tender BUS: negative Vagina: atrophic appearance scant discharge noted. Ph: 4.0  ,Wet prep taken,  Cervix: absent Uterus: absent Adnexa: no masses or fullness noted  Wet Prep results:positive for yeast external only  A:Normal pelvic exam Atrophic vaginitis Yeast vulvitis   P:Discussed findings of yeast vaginitis and etiology. Discussed Aveeno or baking soda sitz bath for comfort. Avoid moist clothes or pads for extended period of time. If working out in gym clothes or swim suits for long periods of time change underwear or bottoms of swimsuit if possible. Olive Oil/Coconut Oil use for skin protection prior to activity can be used to external skin. Rx: Mycolog cream see order with instructions  Discussed finding of atrophic vaginitis and etiology with menopause. Discussed options for treatment with OTC coconut or olive oil or estrogen products. Patient would like to try coconut oil. Instructions given for use in vagina and external area and for sexual activity. Questions addressed. Will advise if no change or  problems. Will check at upcoming aex.  Rv prn

## 2014-10-27 NOTE — Patient Instructions (Signed)

## 2014-10-30 NOTE — Progress Notes (Signed)
Reviewed personally.  M. Suzanne Maccoy Haubner, MD.  

## 2014-11-16 ENCOUNTER — Ambulatory Visit: Payer: 59 | Admitting: Nurse Practitioner

## 2014-11-16 ENCOUNTER — Encounter: Payer: Self-pay | Admitting: Nurse Practitioner

## 2014-11-16 ENCOUNTER — Ambulatory Visit (INDEPENDENT_AMBULATORY_CARE_PROVIDER_SITE_OTHER): Payer: 59 | Admitting: Nurse Practitioner

## 2014-11-16 VITALS — BP 122/81 | HR 64 | Ht 66.0 in | Wt 188.0 lb

## 2014-11-16 DIAGNOSIS — M545 Low back pain: Secondary | ICD-10-CM

## 2014-11-16 NOTE — Progress Notes (Signed)
I have read the note, and I agree with the clinical assessment and plan.  Courtney Roberson,Courtney Roberson   

## 2014-11-16 NOTE — Progress Notes (Signed)
[facility]  PATIENT: Courtney Roberson DOB: Oct 18, 1960   REASON FOR VISIT: Follow-up for back pain  HISTORY FROM: Patient    HISTORY OF PRESENT ILLNESS:Courtney Roberson, 54 -year-old female returns for followup. She continues to have intermittent low back pain. She is currently taking Aleve, only taking it on a when necessary basis for back pain. She is continuing her yoga. 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 She is trying to lose weight and be active. She is doing specific back exercises.She is stretching she is going to a yoga class. She is asking about water aerobics. She had one fall in the last 6 months off of the ladder while cleaning a ceiling fan. No apparent injury She feels her back pain  is stable. She returns for reevaluation.   HISTORY: of low back pain that dates back approximately one year. The patient indicates that she has some pain in the back, more to the left side than the right. The pain will go down the legs to the feet at times, and the patient will also note some numbness that goes down to the feet as well. The patient does not have actual weakness of the legs. The patient denies any pain in the neck or shoulders. The patient has not had any balance issues, and she denies any falls. The patient denies problems controlling the bowels or the bladder. The patient indicates that if she is in one position too long such as sitting, standing, or lying down, she will have increased back pain. The patient feels better if she changes positions frequently. The patient has been trying to lose weight and be active, and she has noted that her back pain worsened when she was using the elliptical machine. The patient has undergone MRI evaluation of the low back that shows some facet joint arthritis at the L5-S1 level, but there is no evidence of nerve root compression at any level. The patient is sent to this office for  further evaluation. The patient previously was placed on a prednisone taper, and she gained good improvement with the low back pain on this regimen  REVIEW OF SYSTEMS: Full 14 system review of systems performed and notable only for those listed, all others are neg:  Constitutional: neg  Cardiovascular: neg Ear/Nose/Throat: neg  Skin: neg Eyes: neg Respiratory: neg Gastroitestinal: neg  Hematology/Lymphatic: neg  Endocrine: neg Musculoskeletal:neg Allergy/Immunology: neg Neurological: neg Psychiatric: neg Sleep : neg   ALLERGIES: Allergies  Allergen Reactions  . Betadine [Povidone Iodine] Swelling  . Penicillins Rash    HOME MEDICATIONS: Outpatient Prescriptions Prior to Visit  Medication Sig Dispense Refill  . aspirin 81 MG tablet Take 81 mg by mouth daily. Take 2 a day    . B Complex Vitamins (B COMPLEX PO) Take 1 tablet by mouth daily.     . Calcium Carbonate-Vitamin D (CALTRATE 600+D) 600-400 MG-UNIT per chew tablet Chew 1 tablet by mouth daily.    . cetirizine (ZYRTEC) 10 MG tablet Take 10 mg by mouth daily.    . cholecalciferol (VITAMIN D) 1000 UNITS tablet Take 1,000 Units by mouth daily.    . Coenzyme Q10 100 MG capsule Take 100 mg by mouth daily.    . dorzolamide-timolol (COSOPT) 22.3-6.8 MG/ML ophthalmic solution Place 1 drop into both eyes 2 (two) times daily.     . fluticasone (FLONASE) 50 MCG/ACT nasal spray 2 sprays daily.     Marland Kitchen  Magnesium Oxide 250 MG TABS Take 250 mg by mouth daily.    . Multiple Vitamins-Minerals (MULTIVITAMIN PO) Take by mouth daily.    Marland Kitchen nystatin-triamcinolone (MYCOLOG II) cream Apply 1 application topically daily. Apply to area once daily 30 g 0  . Omega-3 Fatty Acids (FISH OIL) 1000 MG CAPS Take 1,000 mg by mouth daily.    . Probiotic Product (PROBIOTIC PO) Take by mouth as needed.     . timolol (BETIMOL) 0.5 % ophthalmic solution Place 1 drop into both eyes daily.    . TRAVATAN Z 0.004 % SOLN ophthalmic solution     . vitamin C  (ASCORBIC ACID) 500 MG tablet Take 500 mg by mouth as needed.      No facility-administered medications prior to visit.    PAST MEDICAL HISTORY: Past Medical History  Diagnosis Date  . Anemia   . Thyroid nodule   . Endometriosis   . Fibroid   . Lumbago   . Glaucoma     bilateral  . Arthritis of back     PAST SURGICAL HISTORY: Past Surgical History  Procedure Laterality Date  . Breast surgery      biopsy benign  . Carpal tunnel release      both hands  . Tubal ligation  1993  . Rotator cuff repair Right 8/09  . Abdominal hysterectomy  9/12    TLH    FAMILY HISTORY: Family History  Problem Relation Age of Onset  . Heart disease Mother   . Hypertension Mother   . Diabetes Mother   . Cancer Father     lung  . Breast cancer Sister   . Heart disease Brother   . Diabetes Brother     SOCIAL HISTORY: History   Social History  . Marital Status: Married    Spouse Name: Jeneen Rinks   . Number of Children: 3  . Years of Education: 16   Occupational History  . Not on file.   Social History Main Topics  . Smoking status: Never Smoker   . Smokeless tobacco: Never Used  . Alcohol Use: No  . Drug Use: No  . Sexual Activity:    Partners: Male    Birth Control/ Protection: Surgical     Comment: TLH   Other Topics Concern  . Not on file   Social History Narrative   Patient is married Jeneen Rinks) and lives at home with her spouse.   Patient has three children.   Patient is retired.   Patient has an Associate's Degree.   Patient is right- handed.   Patient drinks tea- one cup a day, once a week for coffee and sodas once in a while.     PHYSICAL EXAM  Filed Vitals:   11/16/14 1025  BP: 122/81  Pulse: 64  Height: 5\' 6"  (1.676 m)  Weight: 188 lb (85.276 kg)   Body mass index is 30.36 kg/(m^2).  Generalized: Well developed, minimally obese female in no acute distress  Head: normocephalic and atraumatic,. Oropharynx benign  Neck: Supple, no carotid bruits    Cardiac: Regular rate rhythm, no murmur  Musculoskeletal: No deformity   Neurological examination   Mentation: Alert oriented to time, place, history taking. Attention span and concentration appropriate. Recent and remote memory intact.  Follows all commands speech and language fluent.   Cranial nerve II-XII: Pupils were equal round reactive to light extraocular movements were full, visual field were full on confrontational test. Facial sensation and strength were normal. hearing was intact  to finger rubbing bilaterally. Uvula tongue midline. head turning and shoulder shrug were normal and symmetric.Tongue protrusion into cheek strength was normal. Motor: normal bulk and tone, full strength in the BUE, BLE, fine finger movements normal, no pronator drift. No focal weakness Sensory: normal and symmetric to light touch, pinprick, and  Vibration, proprioception  Coordination: finger-nose-finger, heel-to-shin bilaterally, no dysmetria Reflexes: Brachioradialis 2/2, biceps 2/2, triceps 2/2, patellar 2/2, Achilles 1/1, plantar responses were flexor bilaterally. Gait and Station: Rising up from seated position without assistance, normal stance,  moderate stride, good arm swing, smooth turning, able to perform tiptoe, and heel walking without difficulty. Tandem gait is steady  DIAGNOSTIC DATA (LABS, IMAGING, TESTING)  ASSESSMENT AND PLAN  54 y.o. year old female  has a past medical history of  Lumbago; and Arthritis of back. here to follow-up. Her symptoms are stable  Continue Back exercises and stretching to perform several times daily Continue YOGA Water therapy would also be a good option F/U yearly and prn Dennie Bible, [facility], [facility], APRN  [facility] [address] [place], [place] [address] [phone]

## 2014-11-16 NOTE — Patient Instructions (Signed)
Continue Back exercises to perform several times daily Continue YOGA Water therapy F/U yearly and prn

## 2014-11-29 ENCOUNTER — Telehealth: Payer: Self-pay | Admitting: Certified Nurse Midwife

## 2014-11-29 NOTE — Telephone Encounter (Signed)
Left patient a message to call back to reschedule a future appointment that was cancelled by the provider. She then picked up the line and said she will call back to reschedule. Patient in recall.

## 2014-12-30 ENCOUNTER — Telehealth: Payer: Self-pay | Admitting: Certified Nurse Midwife

## 2014-12-30 NOTE — Telephone Encounter (Signed)
Left message to call Kaitlyn at [phone]. 

## 2014-12-30 NOTE — Telephone Encounter (Signed)
Patient would like to speak with nurse to discuss further testing after getting her mammogram results.

## 2015-01-03 NOTE — Telephone Encounter (Signed)
Spoke with Judeen Hammans at [facility]. Mammogram results to be faxed at this time.

## 2015-01-03 NOTE — Telephone Encounter (Signed)
Spoke with patient. Patient states that she received a letter in the mail from [place] advising her that she had "Dense breasts" and should follow up with our office for any further recommendations. States she had mammogram in May at [facility]. Advised I will obtain results from [facility] for Milford Cage, FNP to review while Regina Eck CNM is out of the office so that any further recommendations can be made. Patient is agreeable.

## 2015-01-12 NOTE — Telephone Encounter (Signed)
Dr.Miller, have you received revised mammogram results from [facility]? If not I will call to follow up.

## 2015-01-17 NOTE — Telephone Encounter (Signed)
Can you please?  Thanks.  Have not seen in any of my folders.

## 2015-01-18 NOTE — Telephone Encounter (Signed)
Spoke with Judeen Hammans at [facility] who states she will fax amended report to office at this time.

## 2015-01-18 NOTE — Telephone Encounter (Signed)
MMG amendment received. To Dr.Miller's desk for review.

## 2015-01-23 ENCOUNTER — Ambulatory Visit: Payer: 59 | Admitting: Certified Nurse Midwife

## 2015-01-27 ENCOUNTER — Ambulatory Visit: Payer: Self-pay | Admitting: Certified Nurse Midwife

## 2015-02-01 ENCOUNTER — Other Ambulatory Visit: Payer: 59

## 2015-02-09 NOTE — Telephone Encounter (Signed)
I have left message for Dr. Isaiah Blakes.  Will call again today.  Just FYI.

## 2015-03-08 ENCOUNTER — Ambulatory Visit (INDEPENDENT_AMBULATORY_CARE_PROVIDER_SITE_OTHER): Payer: 59 | Admitting: Certified Nurse Midwife

## 2015-03-08 ENCOUNTER — Encounter: Payer: Self-pay | Admitting: Certified Nurse Midwife

## 2015-03-08 VITALS — BP 118/76 | HR 74 | Resp 16 | Ht 65.5 in | Wt 182.8 lb

## 2015-03-08 DIAGNOSIS — Z0000 Encounter for general adult medical examination without abnormal findings: Secondary | ICD-10-CM | POA: Diagnosis not present

## 2015-03-08 DIAGNOSIS — Z01419 Encounter for gynecological examination (general) (routine) without abnormal findings: Secondary | ICD-10-CM

## 2015-03-08 DIAGNOSIS — E049 Nontoxic goiter, unspecified: Secondary | ICD-10-CM

## 2015-03-08 DIAGNOSIS — Z1211 Encounter for screening for malignant neoplasm of colon: Secondary | ICD-10-CM

## 2015-03-08 LAB — POCT URINALYSIS DIPSTICK
LEUKOCYTES UA: NEGATIVE
Urobilinogen, UA: NEGATIVE
pH, UA: 5

## 2015-03-08 LAB — HEMOGLOBIN, FINGERSTICK: Hemoglobin, fingerstick: 11.5 g/dL — ABNORMAL LOW (ref 12.0–16.0)

## 2015-03-08 NOTE — Patient Instructions (Signed)

## 2015-03-08 NOTE — Progress Notes (Signed)
54 y.o. G3P3 Married  African American Fe here for annual exam.  Menopausal no HRT. Denies vaginal bleeding. Using Coconut oil for vaginal dryness, which is working well. Patient has not been in to see Endocrine for Thyroid, but she wants to change to a female provider. Request referral. Has not had TSH done this year. History of enlarged thyroid. Sees PCP for aex/labs and her PCP has retired. Request information on female providers, plans to re-establish soon. Declines screening labs except for TSH. Working on weight loss with exercise and feels this is helping with other health issues with joint pain. No other health concerns today. Denies any vaginal or UTI symptoms today.   Patient's last menstrual period was 03/16/2011.          Sexually active: Yes.    The current method of family planning is status post hysterectomy.    Exercising: Yes.    walking, yoga qd/twice/wk Smoker:  no  Health Maintenance: Pap:  01/07/11 neg MMG:  12/06/14 breast density category c; negative Colonoscopy:  2004/2005 normal is due   BMD:  Age 38  TDaP:  2009  Labs: 11.5 ; Urine: wbc's +   reports that she has never smoked. She has never used smokeless tobacco. She reports that she does not drink alcohol or use illicit drugs.  Past Medical History  Diagnosis Date  . Anemia   . Thyroid nodule   . Endometriosis   . Fibroid   . Lumbago   . Glaucoma     bilateral  . Arthritis of back     Past Surgical History  Procedure Laterality Date  . Breast surgery      biopsy benign  . Carpal tunnel release      both hands  . Tubal ligation  1993  . Rotator cuff repair Right 8/09  . Abdominal hysterectomy  9/12    TLH    Current Outpatient Prescriptions  Medication Sig Dispense Refill  . aspirin 81 MG tablet Take 81 mg by mouth daily. Take 2 a day    . B Complex Vitamins (B COMPLEX PO) Take 1 tablet by mouth daily.     . Calcium Carbonate-Vitamin D (CALTRATE 600+D) 600-400 MG-UNIT per chew tablet Chew 1  tablet by mouth daily.    . cetirizine (ZYRTEC) 10 MG tablet Take 10 mg by mouth daily.    . cholecalciferol (VITAMIN D) 1000 UNITS tablet Take 1,000 Units by mouth daily.    . Coenzyme Q10 100 MG capsule Take 100 mg by mouth daily.    . dorzolamide-timolol (COSOPT) 22.3-6.8 MG/ML ophthalmic solution Place 1 drop into both eyes 2 (two) times daily.     . fluticasone (FLONASE) 50 MCG/ACT nasal spray 2 sprays daily.     . Magnesium Oxide 250 MG TABS Take 250 mg by mouth daily.    . Multiple Vitamins-Minerals (MULTIVITAMIN PO) Take by mouth daily.    Marland Kitchen nystatin-triamcinolone (MYCOLOG II) cream Apply 1 application topically daily. Apply to area once daily 30 g 0  . Omega-3 Fatty Acids (FISH OIL) 1000 MG CAPS Take 1,000 mg by mouth daily.    . Probiotic Product (PROBIOTIC PO) Take by mouth as needed.     . timolol (BETIMOL) 0.5 % ophthalmic solution Place 1 drop into both eyes daily.    . TRAVATAN Z 0.004 % SOLN ophthalmic solution     . vitamin C (ASCORBIC ACID) 500 MG tablet Take 500 mg by mouth as needed.     Marland Kitchen  latanoprost (XALATAN) 0.005 % ophthalmic solution Place 1 drop into both eyes at bedtime.  6  . prednisoLONE acetate (PRED FORTE) 1 % ophthalmic suspension INSTILL 1 DROP INTO BOTH EYES AS DIRECTED 4 TIMES DAILY X 1 WEEK THEN TWICE DAILY X 1 WEEK  0   No current facility-administered medications for this visit.    Family History  Problem Relation Age of Onset  . Heart disease Mother   . Hypertension Mother   . Diabetes Mother   . Cancer Father     lung  . Breast cancer Sister   . Heart disease Brother   . Diabetes Brother     ROS:  Pertinent items are noted in HPI.  Otherwise, a comprehensive ROS was negative.  Exam:   BP 118/76 mmHg  Pulse 74  Ht 5' 5.5" (1.664 m)  Wt 182 lb 12.8 oz (82.918 kg)  BMI 29.95 kg/m2  LMP 03/16/2011 Height: 5' 5.5" (166.4 cm) Ht Readings from Last 3 Encounters:  03/08/15 5' 5.5" (1.664 m)  11/16/14 5\' 6"  (1.676 m)  10/27/14 5' 5.75" (1.67  m)    General appearance: alert, cooperative and appears stated age Head: Normocephalic, without obvious abnormality, atraumatic Neck: no adenopathy, supple, symmetrical, trachea midline and thyroid normal to inspection and palpation Lungs: clear to auscultation bilaterally Breasts: normal appearance, no masses or tenderness, No nipple retraction or dimpling, No nipple discharge or bleeding, No axillary or supraclavicular adenopathy Heart: regular rate and rhythm Abdomen: soft, non-tender; no masses,  no organomegaly Extremities: extremities normal, atraumatic, no cyanosis or edema Skin: Skin color, texture, turgor normal. No rashes or lesions Lymph nodes: Cervical, supraclavicular, and axillary nodes normal. No abnormal inguinal nodes palpated Neurologic: Grossly normal   Pelvic: External genitalia:  no lesions              Urethra:  normal appearing urethra with no masses, tenderness or lesions              Bartholin's and Skene's: normal                 Vagina: normal appearing vagina with normal color and discharge, no lesions              Cervix: absent              Pap taken: No. Bimanual Exam:  Uterus:  uterus absent              Adnexa: no mass, fullness, tenderness adnexa not palpable               Rectovaginal: Confirms               Anus:  normal sphincter tone, no lesions  Chaperone present: yes  A:  Well Woman with normal exam  Menopausal  No HRT S/P TLH  Fibroids  Vaginal dryness coconut oil working well  Asthma with PCP management  Enlarged thyroid with Endocrine management, no change, requests referral for female provider  Colonoscopy due      P:   Reviewed health and wellness pertinent to exam  Continue coconut oil as directed  Given information on female PCP providers in area, patient will call and schedule  Will make referral for Endocrine and for colonoscopy which is due per patient request.  Pap smear as above   counseled on breast self exam,  mammography screening, adequate intake of calcium and vitamin D, diet and exercise  return annually or prn  An After Visit Summary  was printed and given to the patient.

## 2015-03-09 LAB — TSH: TSH: 1.116 u[IU]/mL (ref 0.350–4.500)

## 2015-03-10 NOTE — Progress Notes (Signed)
Reviewed personally.  M. Suzanne Roanne Haye, MD.  

## 2015-03-13 ENCOUNTER — Telehealth: Payer: Self-pay | Admitting: Certified Nurse Midwife

## 2015-03-13 NOTE — Telephone Encounter (Signed)
Courtney Roberson calling from Dr. Almetta Lovely office [facility]) calling to let the office know Courtney Roberson is already a patient of Dr. Wilson Singer and can not be seen by Dr. Chalmers Cater. PLEASE CALL  Ask for Courtney Roberson [phone]

## 2015-03-14 NOTE — Telephone Encounter (Signed)
Dr.Miller, have we heard from Dr.Bertrand regarding patient's mammogram?

## 2015-03-15 NOTE — Telephone Encounter (Signed)
Thanks for the message.  I spoke with her several weeks ago and she was going to send an amended report.  I have not seen that.  I called and left her a message about this today.  Please keep in your in basket so we can ensure it comes in.  It is supposed to not that have recommendation regarding MRI on it.  You can let pt know and we can send her a copy of the report when it comes as well.  Thanks.

## 2015-03-15 NOTE — Telephone Encounter (Signed)
Spoke with patient. Advised of message as seen below from [place]. Patient is agreeable. Patient would like a copy sent to her home address which I verified with patient. Advised once this has been received and reviewed by Dr.Miller we will send her a copy in the mail. Will give her a call to let her know we have sent this. Patient is agreeable.

## 2015-04-04 NOTE — Telephone Encounter (Signed)
Left message to call [place] at [phone].  Need to advise we have received amendment to her MMG report from [facility]. A copy has been placed in the mail per her request to her home address on file which was verified in our last discussion.

## 2015-04-06 NOTE — Telephone Encounter (Signed)
Spoke with patient. Advised we have received amended mammogram report from [facility] [place]. Dr.Miller has reviewed the report and agrees with amendment. No MRI needed at this time. Advised I have placed a copy of the report in the mail to her home address on file. Patient is agreeable and again verifies this is the correct address.  Routing to provider for final review. Patient agreeable to disposition. Will close encounter.

## 2015-05-03 ENCOUNTER — Encounter: Payer: Self-pay | Admitting: Internal Medicine

## 2015-05-03 ENCOUNTER — Ambulatory Visit (INDEPENDENT_AMBULATORY_CARE_PROVIDER_SITE_OTHER): Payer: 59 | Admitting: Internal Medicine

## 2015-05-03 VITALS — BP 122/80 | HR 62 | Temp 98.1°F | Resp 12 | Ht 65.75 in | Wt 184.6 lb

## 2015-05-03 DIAGNOSIS — E042 Nontoxic multinodular goiter: Secondary | ICD-10-CM

## 2015-05-03 HISTORY — DX: Nontoxic multinodular goiter: E04.2

## 2015-05-03 NOTE — Progress Notes (Signed)
Patient ID: Courtney Roberson, female   DOB: May 27, 1961, 54 y.o.   MRN: [id number]   HPI  Courtney Roberson is a 54 y.o.-year-old female, referred by ObGyn Dr, Melvia Heaps, CNM, for evaluation for multinodular goiter.  In 2012, she was found to have a goiter at physical exam with OB/GYN. She was sent for thyroid ultrasound that showed an increase in size of her thyroid and several small nodules. This was followed by repeat ultrasounds between 2012-2015, with unchanged readings. She is here for reevaluation.  Thyroid U/S (01/24/2014): goiter, largest lobe dimensions: 6.0 and 6.3 cm respectively, for L and R lobes; multiple hypoechoic and cystic lesions, all <7 mm.   Pt denies: - feeling nodules in neck - hoarseness - dysphagia - choking - SOB with lying down But: - has a sensation of pressure in anterior neck (foreign body sensation)  I reviewed pt's thyroid tests: Lab Results  Component Value Date   TSH 1.116 03/08/2015    + FH of thyroid ds.: sister and cousin. Sister died of Breast CA. No FH of thyroid cancer. No h/o radiation tx to head or neck.  No seaweed or kelp. No recent contrast studies. No steroid use. No herbal supplements. No Biotin supplements or Hair, Skin and Nails vitamins.  Pt also has a history of total hysterectomy. She has a history of anemia, low back osteoarthritis, glaucoma.  ROS: Constitutional: + weight gain, + fatigue, + hot flushes, + poor sleep, + nocturia Eyes: + blurry vision, no xerophthalmia ENT: no sore throat, + see HPI, + tinnitus, + hypoacusis Cardiovascular: no CP/+ SOB/no palpitations/+ leg swelling Respiratory: no cough/+ SOB Gastrointestinal: no N/V/D/+ C, no heartburn Musculoskeletal: + muscle aches/+ joint aches Skin: no rashes, + itching, + easy bruising Neurological: no tremors/numbness/tingling/dizziness, no HA Psychiatric: No depression/anxiety + Low libido  Past Medical History  Diagnosis Date  . Anemia   . Thyroid nodule   .  Endometriosis   . Fibroid   . Lumbago   . Glaucoma     bilateral  . Arthritis of back   . 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 Breast surgery      biopsy benign  . Carpal tunnel release      both hands  . Tubal ligation  1993  . Rotator cuff repair Right 8/09  . Abdominal hysterectomy  9/12    [facility]   Social History   Social History  . Marital Status: Married    Spouse Name: Jeneen Rinks   . Number of Children: 3  . Years of Education: 16   Occupational History  .  teacher, retired [employer]    Social History Main Topics  . Smoking status: Never Smoker   . Smokeless tobacco: Never Used  . Alcohol Use: 0.6 oz/week    1 Standard drinks or equivalent per week  . Drug Use: No  . Sexual Activity:    Partners: Male    Birth Control/ Protection: Surgical     Comment: [facility]   Current Outpatient Prescriptions on File Prior to Visit  Medication Sig Dispense Refill  . aspirin 81 MG tablet Take 81 mg by mouth daily. Take 2 a day    . B Complex Vitamins (B COMPLEX PO) Take 1 tablet by mouth daily.     . Calcium Carbonate-Vitamin D (CALTRATE 600+D) 600-400 MG-UNIT per chew tablet Chew 1 tablet by mouth daily.    . cetirizine (ZYRTEC) 10 MG tablet Take 10 mg  by mouth daily.    . cholecalciferol (VITAMIN D) 1000 UNITS tablet Take 1,000 Units by mouth daily.    . Coenzyme Q10 100 MG capsule Take 100 mg by mouth daily.    . dorzolamide-timolol (COSOPT) 22.3-6.8 MG/ML ophthalmic solution Place 1 drop into both eyes 2 (two) times daily.     . fluticasone (FLONASE) 50 MCG/ACT nasal spray 2 sprays daily.     Marland Kitchen latanoprost (XALATAN) 0.005 % ophthalmic solution Place 1 drop into both eyes at bedtime.  6  . Magnesium Oxide 250 MG TABS Take 250 mg by mouth daily.    . Multiple Vitamins-Minerals (MULTIVITAMIN PO) Take by mouth daily.    . Omega-3 Fatty Acids (FISH OIL) 1000 MG CAPS Take 1,000 mg by mouth daily.    . Probiotic Product  (PROBIOTIC PO) Take by mouth as needed.     . TRAVATAN Z 0.004 % SOLN ophthalmic solution Place 1 drop into both eyes at bedtime.     . vitamin C (ASCORBIC ACID) 500 MG tablet Take 500 mg by mouth as needed.     . nystatin-triamcinolone (MYCOLOG II) cream Apply 1 application topically daily. Apply to area once daily (Patient not taking: Reported on 05/03/2015) 30 g 0   No current facility-administered medications on file prior to visit.   Allergies  Allergen Reactions  . Betadine [Povidone Iodine] Swelling  . Penicillins Rash   Family History  Problem Relation Age of Onset  . Heart disease Mother   . Hypertension Mother   . Diabetes Mother   . Cancer Father     lung  . Breast cancer Sister   . Heart disease Brother   . Diabetes Brother    PE: BP 122/80 mmHg  Pulse 62  Temp(Src) 98.1 F (36.7 C) (Oral)  Resp 12  Ht 5' 5.75" (1.67 m)  Wt 184 lb 9.6 oz (83.734 kg)  BMI 30.02 kg/m2  SpO2 99%  LMP 03/16/2011 Wt Readings from Last 3 Encounters:  05/03/15 184 lb 9.6 oz (83.734 kg)  03/08/15 182 lb 12.8 oz (82.918 kg)  11/16/14 188 lb (85.276 kg)   Constitutional: overweight, in NAD Eyes: PERRLA, EOMI, no exophthalmos ENT: moist mucous membranes, no thyromegaly, no cervical lymphadenopathy Cardiovascular: RRR, No MRG Respiratory: CTA B Gastrointestinal: abdomen soft, NT, ND, BS+ Musculoskeletal: no deformities, strength intact in all 4;  Skin: moist, warm, no rashes Neurological: no tremor with outstretched hands, DTR normal in all 4  ASSESSMENT: 1. MNG  PLAN: 1. MNG  - I reviewed the images of her thyroid ultrasound along with the patient. I pointed out that the dominant nodules are small, and most of them are small colloid cysts. Pt does not have a thyroid cancer family history or a personal history of RxTx to head/neck. All these would favor benignity. Also, she has had for thyroid ultrasound between 2012 and 2015, which demonstrated stability of her thyroid nodules.  This is another good sign. - We discussed about following her clinically, especially since she does not have neck compression symptoms, and also have her back in 2 years to repeat the thyroid ultrasound. This would be 3 years after the last one. If this is stable, then she will might need another ultrasound in 5-10 years. She agrees with this plan. - she should let me know if she develops neck compression symptoms, in that case, she will need an ultrasound in another visit sooner. She does have a sensation of foreign body occasionally her throat, but she  mentions that this is not severe enough to determining her to seek thyroidectomy. - We reviewed together her thyroid tests and I explained that these are completely normal. She had many questions about them, and I answered them to the best of my ability. - I advised pt to join my chart  - time spent with the patient: 45 minutes, of which >50% was spent in obtaining information about her symptoms, reviewing her previous labs, imaging tests, evaluations, and treatments, counseling her about her condition (please see the discussed topics above), and developing a plan to further investigate it; she had a number of questions which I addressed.

## 2015-05-03 NOTE — Patient Instructions (Signed)
Please come back in 2 years.   Please call me before the appointment so I can order the new Ultrasound.

## 2015-05-04 NOTE — Progress Notes (Signed)
Thank you for seeing this patient. Will follow with aex.

## 2015-07-02 IMAGING — US US SOFT TISSUE HEAD/NECK
1 series · 14 of 25 positions shown · non-contrast
Comparison: 01/27/2012

` *RADIOLOGY REPORT*
CLINICAL DATA: Follow-up thyroid nodules

THYROID ULTRASOUND
TECHNIQUE: Ultrasound examination of the thyroid gland and adjacent
soft tissues was performed.

[Series 1: us soft tissue head/neck · 0.06mm/px · 14 of 60 slices shown]
[im 1/60]
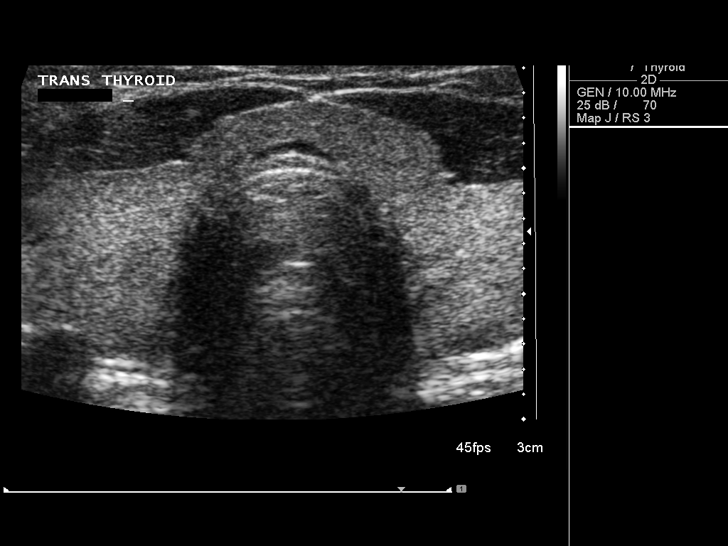
[im 5/60]
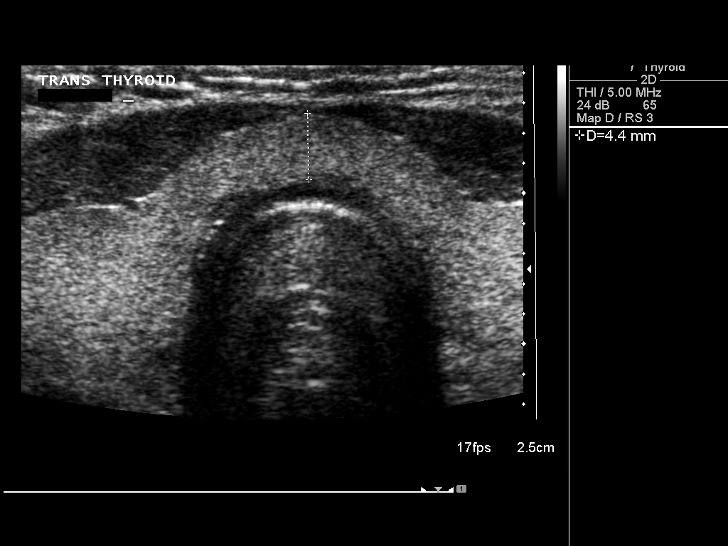
[im 10/60]
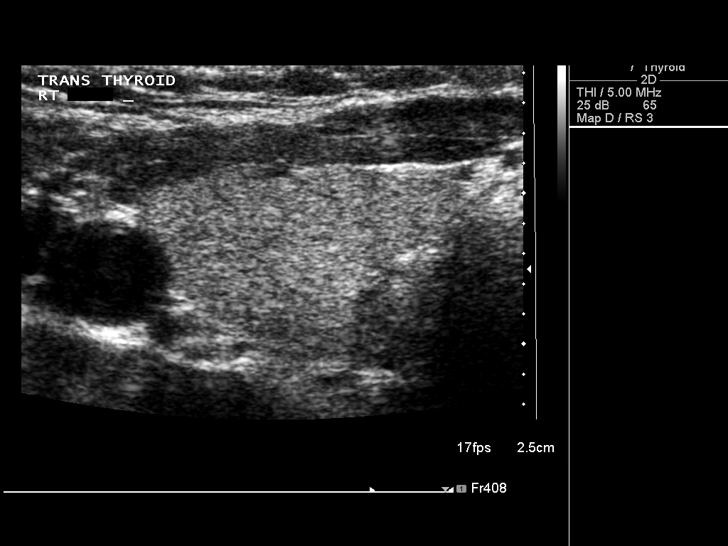
[im 15/60]
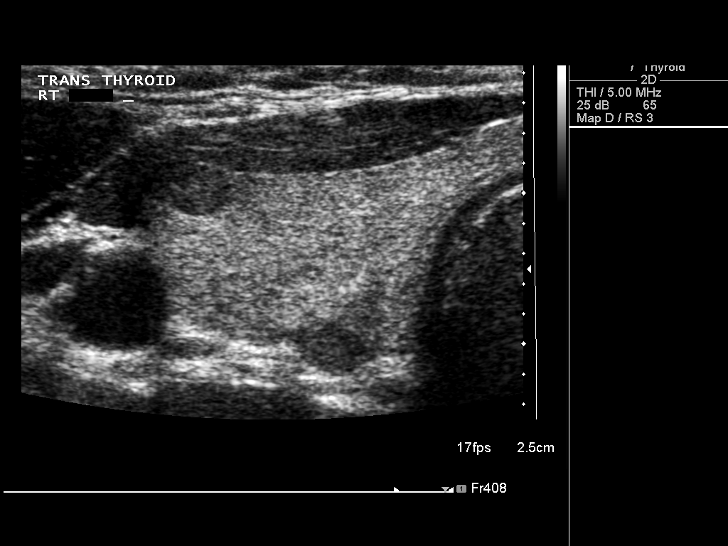
[im 20/60]
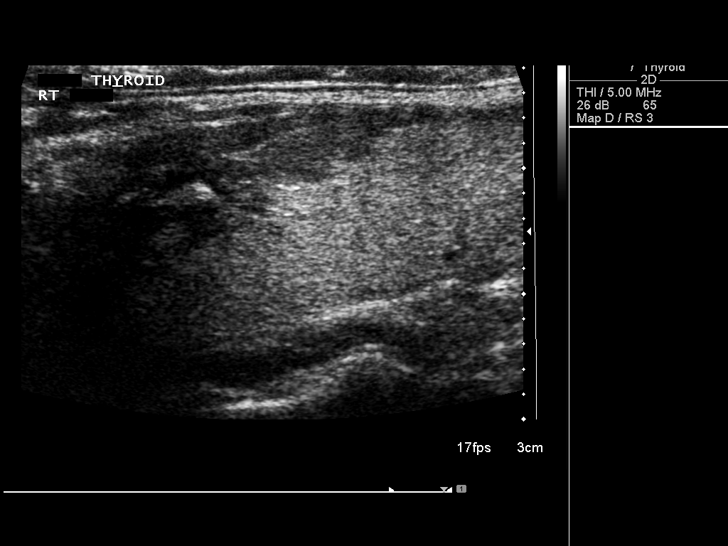
[im 23/60]
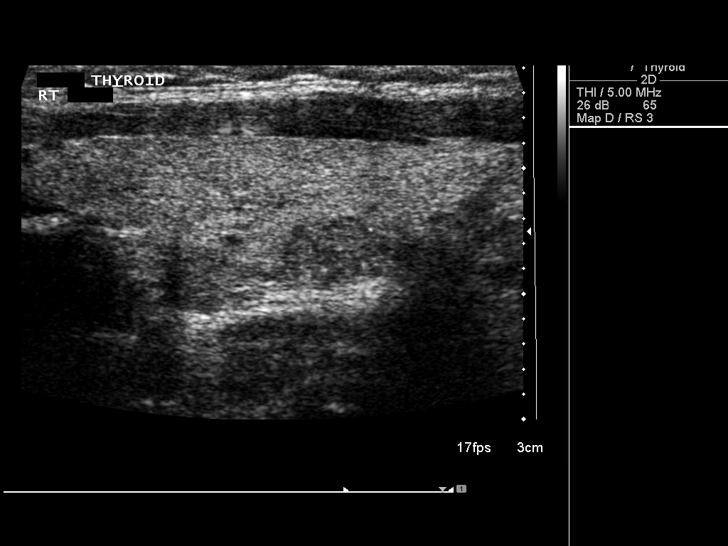
[im 28/60]
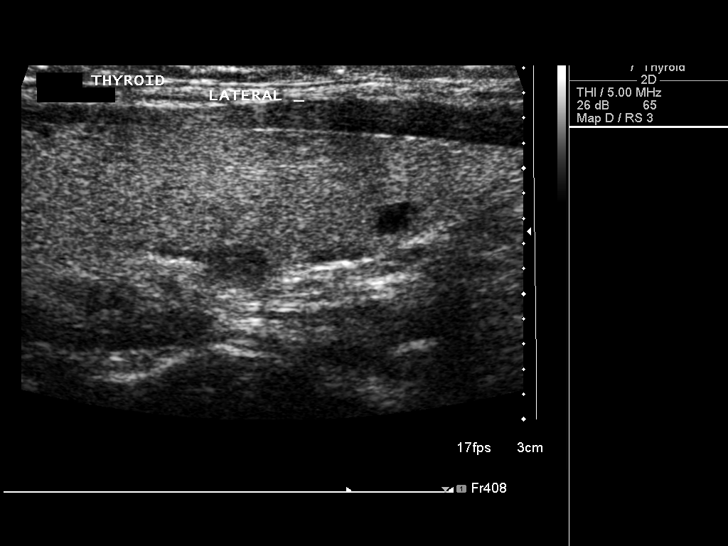
[im 32/60]
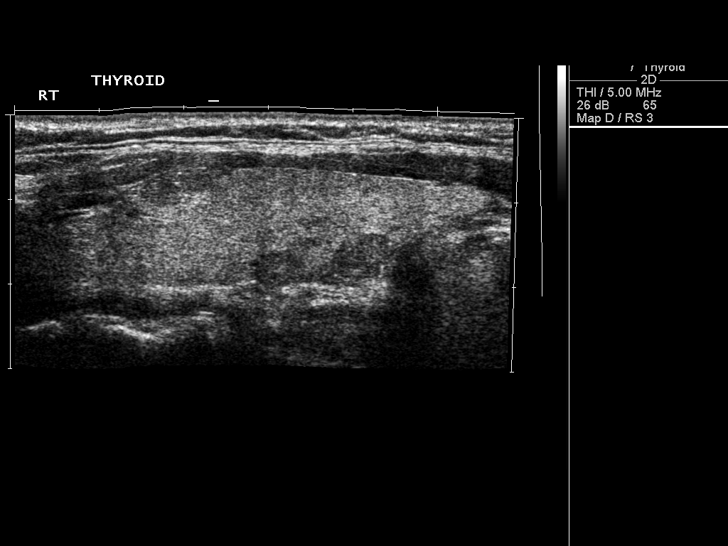
[im 37/60]
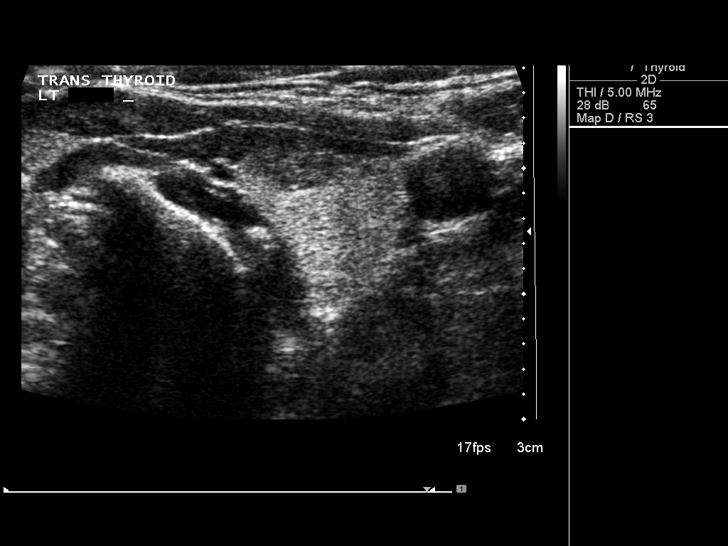
[im 40/60]
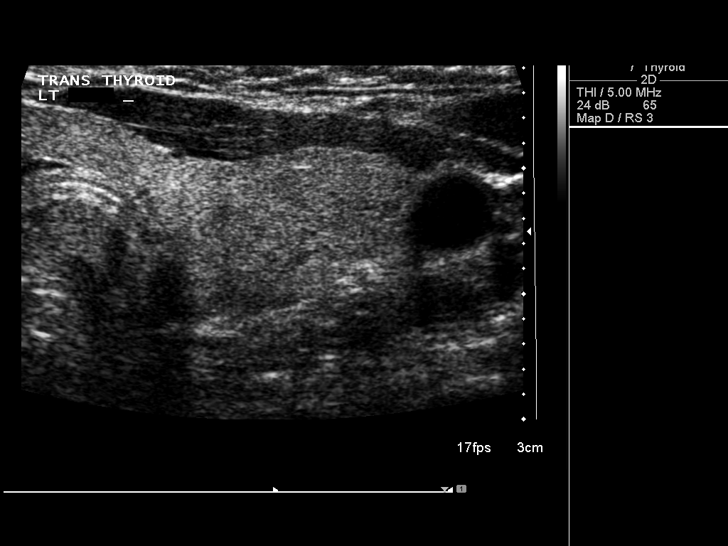
[im 45/60]
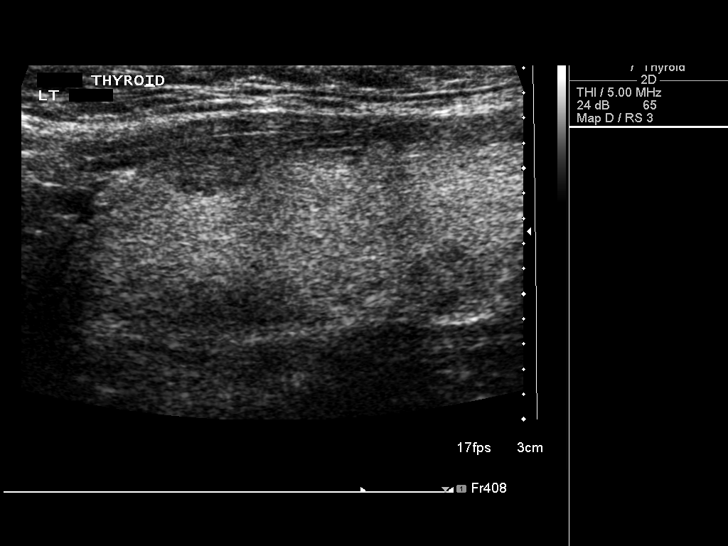
[im 50/60]
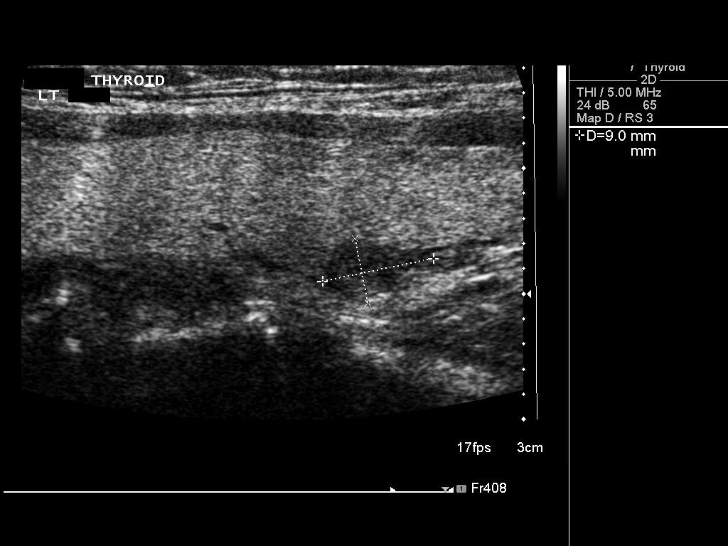
[im 55/60]
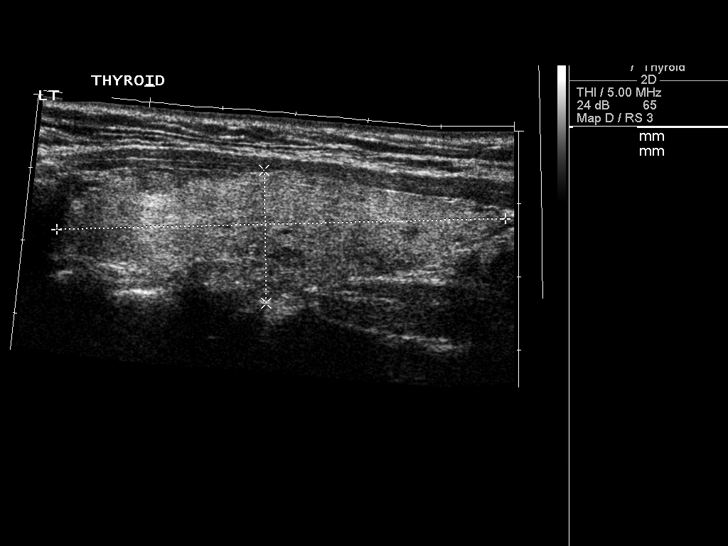
[im 60/60]
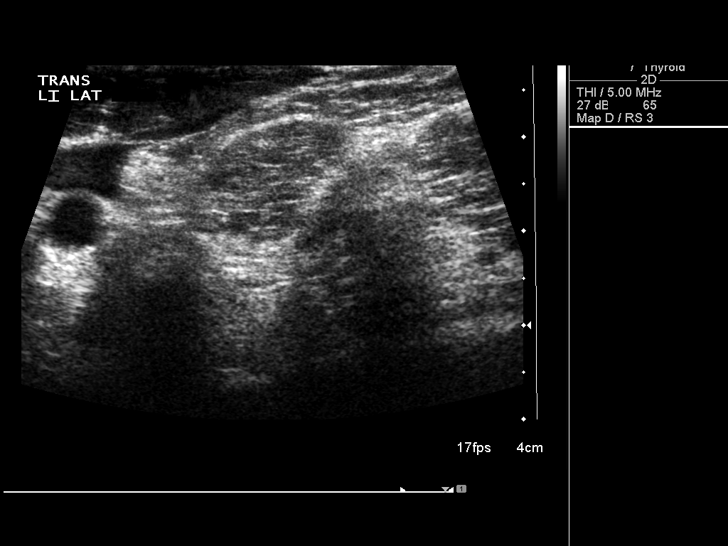

[14 of 25 positions shown; findings below may reference images not displayed]

FINDINGS: Right thyroid lobe:  61 x 15 x 20 mm, mildly inhomogeneous
background parenchyma
Left thyroid lobe:  20x02 x 20 mm
Isthmus:  4.4 mm in thickness

Focal nodules:  Multiple scattered small hypoechoic nodular
regions, all less than 1 cm maximum diameter.  Little change from
previous exam.

Lymphadenopathy:  None visualized.
IMPRESSION: Stable small bilateral nodules. Findings do not meet current
consensus criteria for biopsy.  Follow-up by clinical exam is
recommended.  If patient has known risk factors for thyroid
carcinoma, consider follow-up ultrasound in 12 months.  If patient
is clinically hyperthyroid, consider nuclear medicine thyroid
uptake and scan. This recommendation follows the consensus
statement:  Management of Thyroid Nodules Detected as US:  Society
of Radiologists in Ultrasound Consensus Conference Statement.

## 2015-11-16 ENCOUNTER — Ambulatory Visit (INDEPENDENT_AMBULATORY_CARE_PROVIDER_SITE_OTHER): Payer: 59 | Admitting: Nurse Practitioner

## 2015-11-16 ENCOUNTER — Encounter: Payer: Self-pay | Admitting: Nurse Practitioner

## 2015-11-16 VITALS — BP 120/76 | HR 76 | Resp 20 | Ht 66.0 in | Wt 184.0 lb

## 2015-11-16 DIAGNOSIS — M545 Low back pain, unspecified: Secondary | ICD-10-CM

## 2015-11-16 DIAGNOSIS — G8929 Other chronic pain: Secondary | ICD-10-CM

## 2015-11-16 NOTE — Progress Notes (Signed)
I have read the note, and I agree with the clinical assessment and plan.  Courtney Roberson,Courtney Roberson   

## 2015-11-16 NOTE — Patient Instructions (Signed)
Continue Back exercises and stretching to perform several times daily Continue YOGA Water therapy would also be a good option F/U yearly and prn

## 2015-11-16 NOTE — Progress Notes (Signed)
[facility]  PATIENT: Courtney Roberson DOB: August 17, 1960   REASON FOR VISIT: Follow-up for back pain HISTORY FROM: Patient    HISTORY OF PRESENT ILLNESS:Courtney Roberson, 55 -year-old female returns for followup. She continues to have intermittent low back pain. She is currently taking Aleve, only taking it on a when necessary for back pain. She is continuing her yoga. 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 Cervical spine MRI February 2016 without acute abnormality but degenerative disc disease at C5-C6. She is trying to lose weight and be active. She is doing specific back exercises which are helpful.She is stretching she is going to a yoga class.  She feels her back pain is stable. She returns for reevaluation.   HISTORY: of low back pain that dates back approximately one year. The patient indicates that she has some pain in the back, more to the left side than the right. The pain will go down the legs to the feet at times, and the patient will also note some numbness that goes down to the feet as well. The patient does not have actual weakness of the legs. The patient denies any pain in the neck or shoulders. The patient has not had any balance issues, and she denies any falls. The patient denies problems controlling the bowels or the bladder. The patient indicates that if she is in one position too long such as sitting, standing, or lying down, she will have increased back pain. The patient feels better if she changes positions frequently. The patient has been trying to lose weight and be active, and she has noted that her back pain worsened when she was using the elliptical machine. The patient has undergone MRI evaluation of the low back that shows some facet joint arthritis at the L5-S1 level, but there is no evidence of nerve root compression at any level. The patient is sent to this office for further evaluation. The  patient previously was placed on a prednisone taper, and she gained good improvement with the low back pain on this regimen    REVIEW OF SYSTEMS: Full 14 system review of systems performed and notable only for those listed, all others are neg:  Constitutional: neg  Cardiovascular: neg Ear/Nose/Throat: neg  Skin: neg Eyes: neg Respiratory: neg Gastroitestinal: neg  Hematology/Lymphatic: neg  Endocrine: neg Musculoskeletal: Intermittent back pain Allergy/Immunology: Environmental allergies Neurological: neg Psychiatric: neg Sleep : neg   ALLERGIES: Allergies  Allergen Reactions  . Betadine [Povidone Iodine] Swelling  . Penicillins Rash    HOME MEDICATIONS: Outpatient Prescriptions Prior to Visit  Medication Sig Dispense Refill  . aspirin 81 MG tablet Take 81 mg by mouth daily. Take 2 a day    . B Complex Vitamins (B COMPLEX PO) Take 1 tablet by mouth daily.     . Calcium Carbonate-Vitamin D (CALTRATE 600+D) 600-400 MG-UNIT per chew tablet Chew 1 tablet by mouth daily.    . cetirizine (ZYRTEC) 10 MG tablet Take 10 mg by mouth daily.    . cholecalciferol (VITAMIN D) 1000 UNITS tablet Take 1,000 Units by mouth daily.    . Coenzyme Q10 100 MG capsule Take 100 mg by mouth daily.    . dorzolamide-timolol (COSOPT) 22.3-6.8 MG/ML ophthalmic solution Place 1 drop into both eyes 2 (two) times daily.     . fluticasone (FLONASE) 50 MCG/ACT nasal spray 2 sprays daily.     Marland Kitchen latanoprost (XALATAN) 0.005 % ophthalmic solution Place  1 drop into both eyes at bedtime.  6  . Magnesium Oxide 250 MG TABS Take 250 mg by mouth daily.    . Multiple Vitamins-Minerals (MULTIVITAMIN PO) Take by mouth daily.    Marland Kitchen nystatin-triamcinolone (MYCOLOG II) cream Apply 1 application topically daily. Apply to area once daily 30 g 0  . Omega-3 Fatty Acids (FISH OIL) 1000 MG CAPS Take 1,000 mg by mouth daily.    . Probiotic Product (PROBIOTIC PO) Take by mouth as needed.     . TRAVATAN Z 0.004 % SOLN ophthalmic  solution Place 1 drop into both eyes at bedtime.     . vitamin C (ASCORBIC ACID) 500 MG tablet Take 500 mg by mouth as needed.      No facility-administered medications prior to visit.    PAST MEDICAL HISTORY: Past Medical History  Diagnosis Date  . Anemia   . Thyroid nodule   . Endometriosis   . Fibroid   . Lumbago   . Glaucoma     bilateral  . Arthritis of back   . Goiter, nontoxic, multinodular 05/03/2015    PAST SURGICAL HISTORY: Past Surgical History  Procedure Laterality Date  . Breast surgery      biopsy benign  . Carpal tunnel release      both hands  . Tubal ligation  1993  . Rotator cuff repair Right 8/09  . Abdominal hysterectomy  9/12    [facility]    FAMILY HISTORY: Family History  Problem Relation Age of Onset  . Heart disease Mother   . Hypertension Mother   . Diabetes Mother   . Cancer Father     lung  . Breast cancer Sister   . Heart disease Brother   . Diabetes Brother     SOCIAL HISTORY: Social History   Social History  . Marital Status: Married    Spouse Name: Jeneen Rinks   . Number of Children: 3  . Years of Education: 16   Occupational History  . Not on file.   Social History Main Topics  . Smoking status: Never Smoker   . Smokeless tobacco: Never Used  . Alcohol Use: 0.6 oz/week    1 Standard drinks or equivalent per week  . Drug Use: No  . Sexual Activity:    Partners: Male    Birth Control/ Protection: Surgical     Comment: [facility]   Other Topics Concern  . Not on file   Social History Narrative   Patient is married Jeneen Rinks) and lives at home with her spouse.   Patient has three children.   Patient is retired.   Patient has an Associate's Degree.   Patient is right- handed.   Patient drinks tea- one cup a day, once a week for coffee and sodas once in a while.     PHYSICAL EXAM  Filed Vitals:   11/16/15 1022  BP: 120/76  Pulse: 76  Resp: 20  Height: 5\' 6"  (1.676 m)  Weight: 184 lb (83.462 kg)   Body mass index is 29.71  kg/(m^2). Generalized: Well developed, minimally obese female in no acute distress  Head: normocephalic and atraumatic,. Oropharynx benign  Neck: Supple, no carotid bruits  Cardiac: Regular rate rhythm, no murmur  Musculoskeletal: No deformity   Neurological examination   Mentation: Alert oriented to time, place, history taking. Attention span and concentration appropriate. Recent and remote memory intact. Follows all commands speech and language fluent.   Cranial nerve II-XII: Pupils were equal round reactive to light  extraocular movements were full, visual field were full on confrontational test. Facial sensation and strength were normal. hearing was intact to finger rubbing bilaterally. Uvula tongue midline. head turning and shoulder shrug were normal and symmetric.Tongue protrusion into cheek strength was normal. Motor: normal bulk and tone, full strength in the BUE, BLE, fine finger movements normal, no pronator drift. No focal weakness Sensory: normal and symmetric to light touch, pinprick, and Vibration, proprioception  Coordination: finger-nose-finger, heel-to-shin bilaterally, no dysmetria Reflexes: Brachioradialis 2/2, biceps 2/2, triceps 2/2, patellar 2/2, Achilles 1/1, plantar responses were flexor bilaterally. Gait and Station: Rising up from seated position without assistance, normal stance, moderate stride, good arm swing, smooth turning, able to perform tiptoe, and heel walking without difficulty. Tandem gait is steady   DIAGNOSTIC DATA (LABS, IMAGING, TESTING) - I reviewed patient records, labs, notes, testing and imaging myself where available.  Lab Results  Component Value Date   HGB 11.5* 03/08/2015    Lab Results  Component Value Date   TSH 1.116 03/08/2015      ASSESSMENT AND PLAN 55 y.o. year old female has a past medical history of Lumbago; and Arthritis of back. here to follow-up. Her symptoms are stable  Continue Back exercises and stretching  to perform several times daily Continue YOGA Water therapy would also be a good option F/U yearly and prn Dennie Bible, [facility], [facility], APRN  [facility] [address] [place], [place] [address] [phone]

## 2016-01-03 ENCOUNTER — Encounter: Payer: Self-pay | Admitting: Obstetrics & Gynecology

## 2016-01-31 ENCOUNTER — Emergency Department (HOSPITAL_COMMUNITY): Payer: 59

## 2016-01-31 ENCOUNTER — Telehealth: Payer: Self-pay | Admitting: *Deleted

## 2016-01-31 ENCOUNTER — Encounter (HOSPITAL_COMMUNITY): Payer: Self-pay | Admitting: Family Medicine

## 2016-01-31 ENCOUNTER — Emergency Department (HOSPITAL_COMMUNITY)
Admission: EM | Admit: 2016-01-31 | Discharge: 2016-01-31 | Disposition: A | Discharge status: Home / Self Care | Payer: 59 | Attending: Emergency Medicine | Admitting: Emergency Medicine

## 2016-01-31 DIAGNOSIS — Z7982 Long term (current) use of aspirin: Secondary | ICD-10-CM | POA: Insufficient documentation

## 2016-01-31 DIAGNOSIS — R0789 Other chest pain: Secondary | ICD-10-CM | POA: Diagnosis not present

## 2016-01-31 LAB — CBC
HCT: 37.8 % (ref 36.0–46.0)
Hemoglobin: 12.1 g/dL (ref 12.0–15.0)
MCH: 28.8 pg (ref 26.0–34.0)
MCHC: 32 g/dL (ref 30.0–36.0)
MCV: 90 fL (ref 78.0–100.0)
PLATELETS: 267 10*3/uL (ref 150–400)
RBC: 4.2 MIL/uL (ref 3.87–5.11)
RDW: 13.5 % (ref 11.5–15.5)
WBC: 4.3 10*3/uL (ref 4.0–10.5)

## 2016-01-31 LAB — BASIC METABOLIC PANEL
Anion gap: 6 (ref 5–15)
BUN: 7 mg/dL (ref 6–20)
CHLORIDE: 108 mmol/L (ref 101–111)
CO2: 26 mmol/L (ref 22–32)
CREATININE: 0.96 mg/dL (ref 0.44–1.00)
Calcium: 9.7 mg/dL (ref 8.9–10.3)
GFR calc Af Amer: >60 mL/min (ref >60)
GLUCOSE: 98 mg/dL (ref 65–99)
Potassium: 4 mmol/L (ref 3.5–5.1)
SODIUM: 140 mmol/L (ref 135–145)

## 2016-01-31 LAB — I-STAT TROPONIN, ED
TROPONIN I, POC: 0.01 ng/mL (ref 0.00–0.08)
Troponin i, poc: 0 ng/mL (ref 0.00–0.08)

## 2016-01-31 NOTE — Telephone Encounter (Signed)
Spoke with patient. She states she is usually active but has been experiencing increased fatigue with walking and noticing intermittent swelling in bilateral hands and bilateral feet. She reports "My hands always swell, but it looked like it was going to burst." Patient reports also a feeling of "burning" in her middle chest that is continuous and worsening over the last few days and feels sometimes its hard to catch her breath, especially with walking outside. Hx Asthma.   Advised patient can schedule office visit with Melvia Heaps CNM ,however, feel it is important to seek care urgently/emergently to rule out any cardiac concerns.  Patient has an appointment to establish care with Dr. Birdie Riddle 03/13/16. Advised can call their office to see if they can see her for a problem visit or any local ED or Urgent care as EKG is necessary and we are unable to perform EKG at [facility] office.   Patient states that she understands reasoning to rule out any cardiac concerns. Advised can schedule office visit for follow up if still has concerns regarding hormones. Patient declines at this time and will seek care for evaluation with either ED/Urgent Care or PCP.   Routing to [employer] CNM. Okay to close?

## 2016-01-31 NOTE — Telephone Encounter (Signed)
Patient states she is having swelling on her legs and arms, chest pain and hot flashes and she thinks is menopause related.

## 2016-01-31 NOTE — Telephone Encounter (Signed)
We should check on her tomorrow. I looked in epic to see if she had been seen, no records noted. Will keep in my box

## 2016-01-31 NOTE — ED Provider Notes (Signed)
CSN: [id number]     Arrival date & time 01/31/16  1320 History   First MD Initiated Contact with Patient 01/31/16 1503     Chief Complaint  Patient presents with  . Chest Pain     (Consider location/radiation/quality/duration/timing/severity/associated sxs/prior Treatment) HPI Courtney Roberson is a 55 y.o. female who comes in for evaluation of chest discomfort. Patient reports she has had intermittent chest discomfort ongoing for the past 3 years. Most recently, experienced burning discomfort in her anterior chest that started on Sunday and has been constant since that time. Discomfort is better when she leans forward, worse when she tries to lean back or flat. No overt shortness of breath, nausea or vomiting, diaphoresis, unilateral leg swelling. She does report going for a walk on Monday, in very hot weather, becoming sweaty and swollen in her hands and feet. No recent travel, surgeries, hemoptysis, cough, PND, orthopnea. No other modifying factors  Past Medical History  Diagnosis Date  . Anemia   . Thyroid nodule   . Endometriosis   . Fibroid   . Lumbago   . Glaucoma     bilateral  . Arthritis of back   . 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 Breast surgery      biopsy benign  . Carpal tunnel release      both hands  . Tubal ligation  1993  . Rotator cuff repair Right 8/09  . Abdominal hysterectomy  9/12    TLH   Family History  Problem Relation Age of Onset  . Heart disease Mother   . Hypertension Mother   . Diabetes Mother   . Cancer Father     lung  . Breast cancer Sister   . Heart disease Brother   . Diabetes Brother    Social History  Substance Use Topics  . Smoking status: Never Smoker   . Smokeless tobacco: Never Used  . Alcohol Use: 0.6 oz/week    1 Standard drinks or equivalent per week   OB History    Gravida Para Term Preterm AB TAB SAB Ectopic Multiple Living   3 3        3      Review of  Systems A 10 point review of systems was completed and was negative except for pertinent positives and negatives as mentioned in the history of present illness     Allergies  Betadine and Penicillins  Home Medications   Prior to Admission medications   Medication Sig Start Date End Date Taking? Authorizing Provider  aspirin 81 MG tablet Take 81 mg by mouth daily. Take 2 a day    Historical Provider, MD  B Complex Vitamins (B COMPLEX PO) Take 1 tablet by mouth daily.     Historical Provider, MD  Calcium Carbonate-Vitamin D (CALTRATE 600+D) 600-400 MG-UNIT per chew tablet Chew 1 tablet by mouth daily.    Historical Provider, MD  cetirizine (ZYRTEC) 10 MG tablet Take 10 mg by mouth daily.    Historical Provider, MD  cholecalciferol (VITAMIN D) 1000 UNITS tablet Take 1,000 Units by mouth daily.    Historical Provider, MD  Coenzyme Q10 100 MG capsule Take 100 mg by mouth daily.    Historical Provider, MD  dorzolamide-timolol (COSOPT) 22.3-6.8 MG/ML ophthalmic solution Place 1 drop into both eyes 2 (two) times daily.  02/24/13   Historical Provider, MD  fluticasone (FLONASE) 50 MCG/ACT nasal spray 2 sprays daily.  11/26/12   Historical Provider,  MD  latanoprost (XALATAN) 0.005 % ophthalmic solution Place 1 drop into both eyes at bedtime. 02/08/15   Historical Provider, MD  Magnesium Oxide 250 MG TABS Take 250 mg by mouth daily.    Historical Provider, MD  Multiple Vitamins-Minerals (MULTIVITAMIN PO) Take by mouth daily.    Historical Provider, MD  nystatin-triamcinolone (MYCOLOG II) cream Apply 1 application topically daily. Apply to area once daily 10/27/14   Regina Eck, CNM  Omega-3 Fatty Acids (FISH OIL) 1000 MG CAPS Take 1,000 mg by mouth daily.    Historical Provider, MD  Probiotic Product (PROBIOTIC PO) Take by mouth as needed.     Historical Provider, MD  TRAVATAN Z 0.004 % SOLN ophthalmic solution Place 1 drop into both eyes at bedtime.  04/01/14   Historical Provider, MD  vitamin C  (ASCORBIC ACID) 500 MG tablet Take 500 mg by mouth as needed.     Historical Provider, MD   BP 124/71 mmHg  Pulse 56  Temp(Src) 98 F (36.7 C)  Resp 18  SpO2 100%  LMP 03/16/2011 Physical Exam  Constitutional: She is oriented to person, place, and time. She appears well-developed and well-nourished.  HENT:  Head: Normocephalic and atraumatic.  Mouth/Throat: Oropharynx is clear and moist.  Eyes: Conjunctivae are normal. Pupils are equal, round, and reactive to light. Right eye exhibits no discharge. Left eye exhibits no discharge. No scleral icterus.  Neck: Neck supple.  Cardiovascular: Normal rate, regular rhythm and normal heart sounds.   Pulmonary/Chest: Effort normal and breath sounds normal. No respiratory distress. She has no wheezes. She has no rales. She exhibits tenderness.  Burning sensation is replicated with palpation of anterior chest wall. No other abnormal findings  Abdominal: Soft. There is no tenderness.  Musculoskeletal: She exhibits no tenderness.  Neurological: She is alert and oriented to person, place, and time.  Cranial Nerves II-XII grossly intact  Skin: Skin is warm and dry. No rash noted.  Psychiatric: She has a normal mood and affect.  Nursing note and vitals reviewed.   ED Course  Procedures (including critical care time) Labs Review Labs Reviewed  BASIC METABOLIC PANEL  CBC  I-STAT [place], ED  [facility], ED    Imaging Review Dg Chest 2 View  01/31/2016  CLINICAL DATA:  Shortness of breath and chest pain EXAM: CHEST  2 VIEW COMPARISON:  None. FINDINGS: Lungs are clear. Heart size and pulmonary vascularity are normal. No adenopathy. No pneumothorax. No bone lesions. IMPRESSION: No edema or consolidation. Electronically Signed   By: Lowella Grip III M.D.   On: 01/31/2016 15:31   I have personally reviewed and evaluated these images and lab results as part of my medical decision-making.   EKG Interpretation None     ED ECG  REPORT   Date: 01/31/2016  Rate: 63  Rhythm: normal sinus rhythm  QRS Axis: normal  Intervals: normal  ST/T Wave abnormalities: normal  Conduction Disutrbances:none  Narrative Interpretation:   Old EKG Reviewed: none available  I have personally reviewed the EKG tracing and agree with the computerized printout as noted.  MDM  Patient With chest discomfort since Sunday.Not exertional. States she has had this chest discomfort intermittently over the past 3 years. Clinical presentation is very atypical for ACS. Low suspicion for PE, low well's score.Doubt pericarditis. Physical exam is very reassuring. Discomfort replicated with anterior chest wall palpation. EKG is normal, initial troponin is negative, chest x-ray unremarkable, screening labs unremarkable. Patient does report she has family history of  heart disease, no other risk factors other than age. Plan for delta troponin. If negative, we'll discharge with cardiology follow-up. Final diagnoses:  Atypical chest pain        Comer Locket, PA-C 01/31/16 1832  Comer Locket, PA-C 01/31/16 1833  Margette Fast, MD 01/31/16 [phone]

## 2016-01-31 NOTE — Discharge Instructions (Signed)
There does not appear to be an emergent cause for your symptoms at this time. Your exam, labs and EKG and chest x-ray are all reassuring. Please follow-up with your doctor and cardiology next week for reevaluation. Return to ED for new or worsening symptoms as we discussed.  Nonspecific Chest Pain  Chest pain can be caused by many different conditions. There is always a chance that your pain could be related to something serious, such as a heart attack or a blood clot in your lungs. Chest pain can also be caused by conditions that are not life-threatening. If you have chest pain, it is very important to follow up with your health care provider. CAUSES  Chest pain can be caused by:  Heartburn.  Pneumonia or bronchitis.  Anxiety or stress.  Inflammation around your heart (pericarditis) or lung (pleuritis or pleurisy).  A blood clot in your lung.  A collapsed lung (pneumothorax). It can develop suddenly on its own (spontaneous pneumothorax) or from trauma to the chest.  Shingles infection (varicella-zoster virus).  Heart attack.  Damage to the bones, muscles, and cartilage that make up your chest wall. This can include:  Bruised bones due to injury.  Strained muscles or cartilage due to frequent or repeated coughing or overwork.  Fracture to one or more ribs.  Sore cartilage due to inflammation (costochondritis). RISK FACTORS  Risk factors for chest pain may include:  Activities that increase your risk for trauma or injury to your chest.  Respiratory infections or conditions that cause frequent coughing.  Medical conditions or overeating that can cause heartburn.  Heart disease or family history of heart disease.  Conditions or health behaviors that increase your risk of developing a blood clot.  Having had chicken pox (varicella zoster). SIGNS AND SYMPTOMS Chest pain can feel like:  Burning or tingling on the surface of your chest or deep in your chest.  Crushing,  pressure, aching, or squeezing pain.  Dull or sharp pain that is worse when you move, cough, or take a deep breath.  Pain that is also felt in your back, neck, shoulder, or arm, or pain that spreads to any of these areas. Your chest pain may come and go, or it may stay constant. DIAGNOSIS Lab tests or other studies may be needed to find the cause of your pain. Your health care provider may have you take a test called an ambulatory ECG (electrocardiogram). An ECG records your heartbeat patterns at the time the test is performed. You may also have other tests, such as:  Transthoracic echocardiogram (TTE). During echocardiography, sound waves are used to create a picture of all of the heart structures and to look at how blood flows through your heart.  Transesophageal echocardiogram (TEE).This is a more advanced imaging test that obtains images from inside your body. It allows your health care provider to see your heart in finer detail.  Cardiac monitoring. This allows your health care provider to monitor your heart rate and rhythm in real time.  Holter monitor. This is a portable device that records your heartbeat and can help to diagnose abnormal heartbeats. It allows your health care provider to track your heart activity for several days, if needed.  Stress tests. These can be done through exercise or by taking medicine that makes your heart beat more quickly.  Blood tests.  Imaging tests. TREATMENT  Your treatment depends on what is causing your chest pain. Treatment may include:  Medicines. These may include:  Acid blockers for  heartburn.  Anti-inflammatory medicine.  Pain medicine for inflammatory conditions.  Antibiotic medicine, if an infection is present.  Medicines to dissolve blood clots.  Medicines to treat coronary artery disease.  Supportive care for conditions that do not require medicines. This may include:  Resting.  Applying heat or cold packs to injured  areas.  Limiting activities until pain decreases. HOME CARE INSTRUCTIONS  If you were prescribed an antibiotic medicine, finish it all even if you start to feel better.  Avoid any activities that bring on chest pain.  Do not use any tobacco products, including cigarettes, chewing tobacco, or electronic cigarettes. If you need help quitting, ask your health care provider.  Do not drink alcohol.  Take medicines only as directed by your health care provider.  Keep all follow-up visits as directed by your health care provider. This is important. This includes any further testing if your chest pain does not go away.  If heartburn is the cause for your chest pain, you may be told to keep your head raised (elevated) while sleeping. This reduces the chance that acid will go from your stomach into your esophagus.  Make lifestyle changes as directed by your health care provider. These may include:  Getting regular exercise. Ask your health care provider to suggest some activities that are safe for you.  Eating a heart-healthy diet. A registered dietitian can help you to learn healthy eating options.  Maintaining a healthy weight.  Managing diabetes, if necessary.  Reducing stress. SEEK MEDICAL CARE IF:  Your chest pain does not go away after treatment.  You have a rash with blisters on your chest.  You have a fever. SEEK IMMEDIATE MEDICAL CARE IF:   Your chest pain is worse.  You have an increasing cough, or you cough up blood.  You have severe abdominal pain.  You have severe weakness.  You faint.  You have chills.  You have sudden, unexplained chest discomfort.  You have sudden, unexplained discomfort in your arms, back, neck, or jaw.  You have shortness of breath at any time.  You suddenly start to sweat, or your skin gets clammy.  You feel nauseous or you vomit.  You suddenly feel light-headed or dizzy.  Your heart begins to beat quickly, or it feels like it  is skipping beats. These symptoms may represent a serious problem that is an emergency. Do not wait to see if the symptoms will go away. Get medical help right away. Call your local emergency services (911 in the [place] Do not drive yourself to the hospital.   This information is not intended to replace advice given to you by your health care provider. Make sure you discuss any questions you have with your health care provider.   Document Released: 04/10/2005 Document Revised: 07/22/2014 Document Reviewed: 02/04/2014 Elsevier Interactive Patient Education [employer].

## 2016-01-31 NOTE — ED Notes (Signed)
Pt here for chest pain since Sunday. sts pain is in the middle of her chest and burning, sts worse with lying down. Denies radiation.

## 2016-02-01 NOTE — Telephone Encounter (Signed)
yes

## 2016-02-01 NOTE — Telephone Encounter (Signed)
Patient went to Emergency Department at [facility] yesterday and was discharged with negative troponins.  Patient declines appointment when offered yesterday.  Okay to close?

## 2016-03-01 ENCOUNTER — Encounter: Payer: Self-pay | Admitting: General Practice

## 2016-03-12 ENCOUNTER — Ambulatory Visit: Payer: 59 | Admitting: Certified Nurse Midwife

## 2016-03-12 ENCOUNTER — Telehealth: Payer: Self-pay | Admitting: Emergency Medicine

## 2016-03-12 NOTE — Telephone Encounter (Signed)
Called pt to complete pre-visit phone call. LMOVM to return call. Patient advised on message that if unable to return call to please arrive 10-15 minutes early for appointment to update all insurance and paperwork with front desk.

## 2016-03-13 ENCOUNTER — Encounter: Payer: Self-pay | Admitting: Family Medicine

## 2016-03-13 ENCOUNTER — Ambulatory Visit (INDEPENDENT_AMBULATORY_CARE_PROVIDER_SITE_OTHER): Payer: 59 | Admitting: Family Medicine

## 2016-03-13 ENCOUNTER — Telehealth: Payer: Self-pay | Admitting: General Practice

## 2016-03-13 VITALS — BP 123/73 | HR 91 | Temp 98.1°F | Resp 16 | Ht 66.0 in | Wt 194.4 lb

## 2016-03-13 DIAGNOSIS — Z0000 Encounter for general adult medical examination without abnormal findings: Secondary | ICD-10-CM | POA: Diagnosis not present

## 2016-03-13 DIAGNOSIS — K5900 Constipation, unspecified: Secondary | ICD-10-CM | POA: Diagnosis not present

## 2016-03-13 DIAGNOSIS — K219 Gastro-esophageal reflux disease without esophagitis: Secondary | ICD-10-CM | POA: Diagnosis not present

## 2016-03-13 DIAGNOSIS — K5909 Other constipation: Secondary | ICD-10-CM

## 2016-03-13 DIAGNOSIS — Z23 Encounter for immunization: Secondary | ICD-10-CM | POA: Diagnosis not present

## 2016-03-13 LAB — LIPID PANEL
CHOL/HDL RATIO: 2.3 ratio (ref <=5.0)
CHOLESTEROL: 224 mg/dL — AB (ref 125–200)
HDL: 99 mg/dL (ref >=46)
LDL Cholesterol: 104 mg/dL (ref <130)
Triglycerides: 103 mg/dL (ref <150)
VLDL: 21 mg/dL (ref <30)

## 2016-03-13 LAB — CBC WITH DIFFERENTIAL/PLATELET
Basophils Absolute: 0 cells/uL (ref 0–200)
Basophils Relative: 0 %
Eosinophils Absolute: 102 cells/uL (ref 15–500)
Eosinophils Relative: 2 %
HCT: 37.9 % (ref 35.0–45.0)
Hemoglobin: 12.7 g/dL (ref 11.7–15.5)
Lymphocytes Relative: 49 %
Lymphs Abs: 2499 cells/uL (ref 850–3900)
MCH: 29.1 pg (ref 27.0–33.0)
MCHC: 33.5 g/dL (ref 32.0–36.0)
MCV: 86.9 fL (ref 80.0–100.0)
MPV: 10 fL (ref 7.5–12.5)
Monocytes Absolute: 306 cells/uL (ref 200–950)
Monocytes Relative: 6 %
Neutro Abs: 2193 cells/uL (ref 1500–7800)
Neutrophils Relative %: 43 %
Platelets: 253 10*3/uL (ref 140–400)
RBC: 4.36 MIL/uL (ref 3.80–5.10)
RDW: 13.8 % (ref 11.0–15.0)
WBC: 5.1 10*3/uL (ref 3.8–10.8)

## 2016-03-13 LAB — BASIC METABOLIC PANEL
BUN: 13 mg/dL (ref 7–25)
CO2: 27 mmol/L (ref 20–31)
Calcium: 9.9 mg/dL (ref 8.6–10.4)
Chloride: 105 mmol/L (ref 98–110)
Creat: 0.99 mg/dL (ref 0.50–1.05)
Glucose, Bld: 92 mg/dL (ref 65–99)
Potassium: 4.7 mmol/L (ref 3.5–5.3)
Sodium: 141 mmol/L (ref 135–146)

## 2016-03-13 LAB — TSH: TSH: 1.67 mIU/L

## 2016-03-13 LAB — HEPATIC FUNCTION PANEL
ALBUMIN: 4.4 g/dL (ref 3.6–5.1)
ALK PHOS: 70 U/L (ref 33–130)
ALT: 12 U/L (ref 6–29)
AST: 21 U/L (ref 10–35)
Bilirubin, Direct: 0.3 mg/dL — ABNORMAL HIGH (ref <=0.2)
Indirect Bilirubin: 1.2 mg/dL (ref 0.2–1.2)
TOTAL PROTEIN: 7.5 g/dL (ref 6.1–8.1)
Total Bilirubin: 1.5 mg/dL — ABNORMAL HIGH (ref 0.2–1.2)

## 2016-03-13 MED ORDER — LINACLOTIDE 72 MCG PO CAPS: 72.0000 ug | ORAL_CAPSULE | Freq: Every day | ORAL | 1 refills | Status: DC

## 2016-03-13 MED ORDER — OMEPRAZOLE 20 MG PO CPDR: 20.0000 mg | DELAYED_RELEASE_CAPSULE | Freq: Every day | ORAL | 3 refills | Status: DC

## 2016-03-13 NOTE — Progress Notes (Signed)
Pre visit review using our clinic review tool, if applicable. No additional management support is needed unless otherwise documented below in the visit note. 

## 2016-03-13 NOTE — Patient Instructions (Signed)
Follow up in 1 year or as needed We'll notify you of your lab results and make any changes if needed (including a sooner follow up visit if required) Continue to work on healthy diet and regular exercise- you can do it!!! Start the Omeprazole once daily to decrease the stomach acid (I think this is the cause of your chest pain) Start the Linzess once daily for the chronic constipation Drink plenty of water to help w/ constipation Call with any questions or concerns Welcome!  We're glad to have you! Happy Labor Day!!!

## 2016-03-13 NOTE — Telephone Encounter (Signed)
PA for linzess received from pharmacy. Began in covermymeds (Key: [id number] - PA Case ID: [id number] - Rx #: [id number])

## 2016-03-13 NOTE — Progress Notes (Signed)
   Subjective:    Patient ID: Courtney Roberson, female    DOB: 1960-08-23, 55 y.o.   MRN: [id number]  HPI New.  Previous MD- Greta Doom.  GYN- [facility] [facility] [employer])  UTD on mammo, colonoscopy  CPE- no concerns today except chronic constipation   Review of Systems Patient reports no vision/ hearing changes, adenopathy,fever, weight change,  persistant/recurrent hoarseness , swallowing issues, chest pain, palpitations, edema, persistant/recurrent cough, hemoptysis, dyspnea (rest/exertional/paroxysmal nocturnal), gastrointestinal bleeding (melena, rectal bleeding), abdominal pain, GU symptoms (dysuria, hematuria, incontinence), Gyn symptoms (abnormal  bleeding, pain),  syncope, focal weakness, memory loss, numbness & tingling, skin/hair/nail changes, abnormal bruising or bleeding, anxiety, or depression.   + chronic constipation + GERD    Objective:   Physical Exam General Appearance:    Alert, cooperative, no distress, appears stated age, obese  Head:    Normocephalic, without obvious abnormality, atraumatic  Eyes:    PERRL, conjunctiva/corneas clear, EOM's intact, fundi    benign, both eyes  Ears:    Normal TM's and external ear canals, both ears  Nose:   Nares normal, septum midline, mucosa normal, no drainage    or sinus tenderness  Throat:   Lips, mucosa, and tongue normal; teeth and gums normal  Neck:   Supple, symmetrical, trachea midline, no adenopathy;    Thyroid: goiter  Back:     Symmetric, no curvature, ROM normal, no CVA tenderness  Lungs:     Clear to auscultation bilaterally, respirations unlabored  Chest Wall:    No tenderness or deformity   Heart:    Regular rate and rhythm, S1 and S2 normal, no murmur, rub   or gallop  Breast Exam:    Deferred to GYN  Abdomen:     Soft, non-tender, bowel sounds active all four quadrants,    no masses, no organomegaly  Genitalia:    Deferred to GYN  Rectal:    Extremities:   Extremities normal, atraumatic, no cyanosis or edema  Pulses:   2+  and symmetric all extremities  Skin:   Skin color, texture, turgor normal, no rashes or lesions  Lymph nodes:   Cervical, supraclavicular, and axillary nodes normal  Neurologic:   CNII-XII intact, normal strength, sensation and reflexes    throughout          Assessment & Plan:  PE- pt's WNL w/ exception of known goiter and obesity.  UTD on GYN, colonoscopy.  Check labs.  Flu shot given today.  Anticipatory guidance provided.   GERD- suspect her recent burning CP that is worse w/ lying down that required ER visit recently is actually GERD.  Start low dose PPI and monitor for improvement.  Chronic constipation- new to provider, ongoing for pt.  She does not like taking Miralax b/c it's gritty.  Start low dose Linzess and monitor for improvement.  Reviewed lifestyle and dietary modifications that will help.  Pt expressed understanding and is in agreement w/ plan.

## 2016-03-14 ENCOUNTER — Encounter: Payer: Self-pay | Admitting: General Practice

## 2016-03-14 LAB — VITAMIN D 25 HYDROXY (VIT D DEFICIENCY, FRACTURES): VIT D 25 HYDROXY: 30 ng/mL (ref 30–100)

## 2016-03-19 NOTE — Telephone Encounter (Signed)
PA for linzess approved 03/13/2016-09/11/2016

## 2016-04-03 ENCOUNTER — Ambulatory Visit: Payer: 59 | Admitting: Certified Nurse Midwife

## 2016-04-10 ENCOUNTER — Ambulatory Visit (INDEPENDENT_AMBULATORY_CARE_PROVIDER_SITE_OTHER): Payer: 59 | Admitting: Certified Nurse Midwife

## 2016-04-10 ENCOUNTER — Encounter: Payer: Self-pay | Admitting: Certified Nurse Midwife

## 2016-04-10 VITALS — BP 104/64 | HR 70 | Resp 16 | Ht 65.5 in | Wt 197.0 lb

## 2016-04-10 DIAGNOSIS — Z01419 Encounter for gynecological examination (general) (routine) without abnormal findings: Secondary | ICD-10-CM

## 2016-04-10 DIAGNOSIS — E049 Nontoxic goiter, unspecified: Secondary | ICD-10-CM | POA: Diagnosis not present

## 2016-04-10 DIAGNOSIS — N951 Menopausal and female climacteric states: Secondary | ICD-10-CM | POA: Diagnosis not present

## 2016-04-10 DIAGNOSIS — Z0000 Encounter for general adult medical examination without abnormal findings: Secondary | ICD-10-CM

## 2016-04-10 LAB — POCT URINALYSIS DIPSTICK
BILIRUBIN UA: NEGATIVE
Blood, UA: NEGATIVE
GLUCOSE UA: NEGATIVE
KETONES UA: NEGATIVE
LEUKOCYTES UA: NEGATIVE
Nitrite, UA: NEGATIVE
PH UA: 5
Protein, UA: NEGATIVE
Urobilinogen, UA: NEGATIVE

## 2016-04-10 NOTE — Progress Notes (Signed)
55 y.o. G3P3 Married  African American Fe here for annual exam. Menopausal no HRT. Sees PCP, Tabori as new patient and exam,labs recently..  Denies vaginal bleeding. Using coconut oil for vaginal dryness  With good results. Working on diet for weight loss and having some constipation. Working on water intake to help. Using [place] seeds also to help with also. May do a colon cleanse, but researching the option. No health issues today.   Patient's last menstrual period was 03/16/2011.          Sexually active: Yes.    The current method of family planning is status post hysterectomy.    Exercising: Yes.    walking, yoga Smoker:  no  Health Maintenance: Pap:  01-07-11 neg MMG:  12-12-15 category c density birads 1:neg 3D Colonoscopy:  2016 no polyps BMD:   2011 TDaP:  2013 Shingles: no Pneumonia: 2013 Hep C and HIV: not done Labs: poct urine-neg Self breast exam: pt checks them   reports that she has never smoked. She has never used smokeless tobacco. She reports that she drinks about 0.6 oz of alcohol per week . She reports that she does not use drugs.  Past Medical History:  Diagnosis Date  . Anemia   . Arthritis of back   . Endometriosis   . Fibroid   . Glaucoma    bilateral  . 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 Lumbago   . Thyroid nodule     Past Surgical History:  Procedure Laterality Date  . ABDOMINAL HYSTERECTOMY  9/12   TLH  . BREAST SURGERY     biopsy benign  . CARPAL TUNNEL RELEASE     both hands  . ROTATOR CUFF REPAIR Right 8/09  . TUBAL LIGATION  1993    Current Outpatient Prescriptions  Medication Sig Dispense Refill  . aspirin 81 MG tablet Take 81 mg by mouth daily. Take 2 a day    . B Complex Vitamins (B COMPLEX PO) Take 1 tablet by mouth daily.     . Calcium Carbonate-Vitamin D (CALTRATE 600+D) 600-400 MG-UNIT per chew tablet Chew 1 tablet by mouth daily.    . cetirizine (ZYRTEC) 10 MG tablet Take 10 mg by mouth daily.    . cholecalciferol  (VITAMIN D) 1000 UNITS tablet Take 1,000 Units by mouth daily.    . Coenzyme Q10 100 MG capsule Take 100 mg by mouth daily.    . Cyanocobalamin (VITAMIN B12 PO) Take by mouth.    . dorzolamide-timolol (COSOPT) 22.3-6.8 MG/ML ophthalmic solution Place 1 drop into both eyes 2 (two) times daily.     . fluticasone (FLONASE) 50 MCG/ACT nasal spray 2 sprays daily.     Marland Kitchen latanoprost (XALATAN) 0.005 % ophthalmic solution Place 1 drop into both eyes at bedtime.  6  . Magnesium Oxide 250 MG TABS Take 250 mg by mouth daily.    . Multiple Vitamins-Minerals (MULTIVITAMIN PO) Take by mouth daily.    . Omega-3 Fatty Acids (FISH OIL) 1000 MG CAPS Take 1,000 mg by mouth daily.    . vitamin C (ASCORBIC ACID) 500 MG tablet Take 500 mg by mouth as needed.     . Probiotic Product (PROBIOTIC PO) Take by mouth as needed.      No current facility-administered medications for this visit.     Family History  Problem Relation Age of Onset  . Heart disease Mother   . Hypertension Mother   . Diabetes Mother   . Cancer Father  lung  . Breast cancer Sister   . Heart disease Brother   . Diabetes Brother   . Cancer Maternal Grandmother     ovarian  . Heart disease Maternal Grandfather     ROS:  Pertinent items are noted in HPI.  Otherwise, a comprehensive ROS was negative.  Exam:   BP 104/64   Pulse 70   Resp 16   Ht 5' 5.5" (1.664 m)   Wt 197 lb (89.4 kg)   LMP 03/16/2011   BMI 32.28 kg/m  Height: 5' 5.5" (166.4 cm) Ht Readings from Last 3 Encounters:  04/10/16 5' 5.5" (1.664 m)  03/13/16 5\' 6"  (1.676 m)  11/16/15 5\' 6"  (1.676 m)    General appearance: alert, cooperative and appears stated age Head: Normocephalic, without obvious abnormality, atraumatic Neck: no adenopathy, supple, symmetrical, trachea midline and thyroid enlarged with nodules noted Lungs: clear to auscultation bilaterally Breasts: normal appearance, no masses or tenderness, No nipple retraction or dimpling, No nipple discharge  or bleeding, No axillary or supraclavicular adenopathy Heart: regular rate and rhythm Abdomen: soft, non-tender; no masses,  no organomegaly Extremities: extremities normal, atraumatic, no cyanosis or edema Skin: Skin color, texture, turgor normal. No rashes or lesions Lymph nodes: Cervical, supraclavicular, and axillary nodes normal. No abnormal inguinal nodes palpated Neurologic: Grossly normal   Pelvic: External genitalia:  no lesions              Urethra:  normal appearing urethra with no masses, tenderness or lesions              Bartholin's and Skene's: normal                 Vagina: normal appearing vagina with normal color and discharge, no lesions              Cervix: absent              Pap taken: No. Bimanual Exam:  Uterus:  uterus absent              Adnexa: normal adnexa and no mass, fullness, tenderness               Rectovaginal: Confirms               Anus:  normal sphincter tone, no lesions  Chaperone present: yes  A:  Well Woman with normal exam  Menopausal no HRT s/p TLH for endometrosis, ovaries retained  Enlarged thyroid with nodules with endocrine management  Vaginal dryness with coconut oil use working well  PCP management of allergies   P:   Reviewed health and wellness pertinent to exam  Continue follow up as indicated with MD  Pap smear as above not taken   counseled on breast self exam, mammography screening, adequate intake of calcium and vitamin D, diet and exercise, Kegel's exercises return annually or prn  An After Visit Summary was printed and given to the patient.

## 2016-04-10 NOTE — Patient Instructions (Signed)

## 2016-04-12 NOTE — Progress Notes (Signed)
Encounter reviewed Jill Jertson, MD   

## 2016-07-24 ENCOUNTER — Ambulatory Visit (INDEPENDENT_AMBULATORY_CARE_PROVIDER_SITE_OTHER): Payer: 59 | Admitting: Physician Assistant

## 2016-07-24 ENCOUNTER — Encounter: Payer: Self-pay | Admitting: Physician Assistant

## 2016-07-24 VITALS — BP 114/78 | HR 62 | Temp 98.3°F | Resp 16 | Ht 66.0 in | Wt 199.0 lb

## 2016-07-24 DIAGNOSIS — B9689 Other specified bacterial agents as the cause of diseases classified elsewhere: Secondary | ICD-10-CM

## 2016-07-24 DIAGNOSIS — J019 Acute sinusitis, unspecified: Secondary | ICD-10-CM | POA: Diagnosis not present

## 2016-07-24 MED ORDER — DOXYCYCLINE HYCLATE 100 MG PO CAPS: 100.0000 mg | ORAL_CAPSULE | Freq: Two times a day (BID) | ORAL | 0 refills | Status: DC

## 2016-07-24 MED ORDER — BENZONATATE 100 MG PO CAPS: 100.0000 mg | ORAL_CAPSULE | Freq: Two times a day (BID) | ORAL | 0 refills | Status: DC | PRN

## 2016-07-24 MED ORDER — FLUTICASONE PROPIONATE 50 MCG/ACT NA SUSP: 2.0000 | Freq: Every day | NASAL | 6 refills | Status: AC

## 2016-07-24 NOTE — Progress Notes (Signed)
Pre visit review using our clinic review tool, if applicable. No additional management support is needed unless otherwise documented below in the visit note. 

## 2016-07-24 NOTE — Progress Notes (Signed)
Patient presents to clinic today c/o 2 weeks head congestion with sinus/facial pain along with sore throat, PND, ear pressure. Endorses bilateral ear pain. Endorses dry cough worse at night. Is affecting sleep. Denies chest pain or SOB. Endorses chills but denies fever. Has taken Tylenol Cold and Flu, Mucinex and Aleve-D with only some relief in symptoms.   Past Medical History:  Diagnosis Date  . Anemia   . Arthritis of back   . Endometriosis   . Fibroid   . Glaucoma    bilateral  . 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 Lumbago   . Thyroid nodule     Current Outpatient Prescriptions on File Prior to Visit  Medication Sig Dispense Refill  . dorzolamide-timolol (COSOPT) 22.3-6.8 MG/ML ophthalmic solution Place 1 drop into both eyes 2 (two) times daily.     Marland Kitchen latanoprost (XALATAN) 0.005 % ophthalmic solution Place 1 drop into both eyes at bedtime.  6  . aspirin 81 MG tablet Take 81 mg by mouth daily. Take 2 a day    . B Complex Vitamins (B COMPLEX PO) Take 1 tablet by mouth daily.     . Calcium Carbonate-Vitamin D (CALTRATE 600+D) 600-400 MG-UNIT per chew tablet Chew 1 tablet by mouth daily.    . cetirizine (ZYRTEC) 10 MG tablet Take 10 mg by mouth daily.    . cholecalciferol (VITAMIN D) 1000 UNITS tablet Take 1,000 Units by mouth daily.    . Coenzyme Q10 100 MG capsule Take 100 mg by mouth daily.    . Cyanocobalamin (VITAMIN B12 PO) Take by mouth.    . fluticasone (FLONASE) 50 MCG/ACT nasal spray 2 sprays daily.     . Magnesium Oxide 250 MG TABS Take 250 mg by mouth daily.    . Multiple Vitamins-Minerals (MULTIVITAMIN PO) Take by mouth daily.    . Omega-3 Fatty Acids (FISH OIL) 1000 MG CAPS Take 1,000 mg by mouth daily.    . Probiotic Product (PROBIOTIC PO) Take by mouth as needed.     . vitamin C (ASCORBIC ACID) 500 MG tablet Take 500 mg by mouth as needed.      No current facility-administered medications on file prior to visit.     Allergies  Allergen Reactions  .  Betadine [Povidone Iodine] Swelling  . Penicillins Rash    Family History  Problem Relation Age of Onset  . Heart disease Mother   . Hypertension Mother   . Diabetes Mother   . Cancer Father     lung  . Breast cancer Sister   . Heart disease Brother   . Diabetes Brother   . Cancer Maternal Grandmother     ovarian  . Heart disease Maternal Grandfather     Social History   Social History  . Marital status: Married    Spouse name: Jeneen Rinks   . Number of children: 3  . Years of education: 59   Social History Main Topics  . Smoking status: Never Smoker  . Smokeless tobacco: Never Used  . Alcohol use 0.6 oz/week    1 Glasses of wine per week  . Drug use: No  . Sexual activity: Yes    Partners: Male    Birth control/ protection: Surgical     Comment: TLH   Other Topics Concern  . None   Social History Narrative   Patient is married Jeneen Rinks) and lives at home with her spouse.   Patient has three children.   Patient is retired.   Patient  has an Associate's Degree.   Patient is right- handed.   Patient drinks tea- one cup a day, once a week for coffee and sodas once in a while.   Review of Systems - See HPI.  All other ROS are negative.  BP 114/78   Pulse 62   Temp 98.3 F (36.8 C) (Oral)   Resp 16   Ht 5\' 6"  (1.676 m)   Wt 199 lb (90.3 kg)   LMP 03/16/2011   SpO2 98%   BMI 32.12 kg/m   Physical Exam  Constitutional: She is oriented to person, place, and time and well-developed, well-nourished, and in no distress.  HENT:  Head: Normocephalic and atraumatic.  Right Ear: Tympanic membrane is not erythematous and not bulging. A middle ear effusion is present.  Left Ear: Tympanic membrane normal.  Nose: Mucosal edema and rhinorrhea present. Right sinus exhibits frontal sinus tenderness. Left sinus exhibits frontal sinus tenderness.  Mouth/Throat: Uvula is midline, oropharynx is clear and moist and mucous membranes are normal.  Eyes: Conjunctivae are normal.    Neck: Neck supple.  Cardiovascular: Normal rate, regular rhythm, normal heart sounds and intact distal pulses.   Pulmonary/Chest: Effort normal and breath sounds normal. No respiratory distress. She has no wheezes. She has no rales. She exhibits no tenderness.  Lymphadenopathy:    She has no cervical adenopathy.  Neurological: She is alert and oriented to person, place, and time.  Skin: Skin is warm and dry. No rash noted.  Psychiatric: Affect normal.  Vitals reviewed.  Assessment/Plan: 1. Acute bacterial sinusitis Rx Doxycycline. Flonase and Tessalon per orders. Supportive measures and OTC medications reviewed with patient. FU if not resolving.   - doxycycline (VIBRAMYCIN) 100 MG capsule; Take 1 capsule (100 mg total) by mouth 2 (two) times daily.  Dispense: 20 capsule; Refill: 0 - fluticasone (FLONASE) 50 MCG/ACT nasal spray; Place 2 sprays into both nostrils daily.  Dispense: 16 g; Refill: 6 - benzonatate (TESSALON) 100 MG capsule; Take 1 capsule (100 mg total) by mouth 2 (two) times daily as needed for cough.  Dispense: 20 capsule; Refill: 0   Leeanne Rio, [place]

## 2016-07-24 NOTE — Patient Instructions (Signed)
Please take antibiotic as directed.  Increase fluid intake.  Use Saline nasal spray.  Take a daily multivitamin. Start Flonase and Nordic as directed.  Place a humidifier in the bedroom.  Please call or return clinic if symptoms are not improving.  Sinusitis Sinusitis is redness, soreness, and swelling (inflammation) of the paranasal sinuses. Paranasal sinuses are air pockets within the bones of your face (beneath the eyes, the middle of the forehead, or above the eyes). In healthy paranasal sinuses, mucus is able to drain out, and air is able to circulate through them by way of your nose. However, when your paranasal sinuses are inflamed, mucus and air can become trapped. This can allow bacteria and other germs to grow and cause infection. Sinusitis can develop quickly and last only a short time (acute) or continue over a long period (chronic). Sinusitis that lasts for more than 12 weeks is considered chronic.  CAUSES  Causes of sinusitis include:  Allergies.  Structural abnormalities, such as displacement of the cartilage that separates your nostrils (deviated septum), which can decrease the air flow through your nose and sinuses and affect sinus drainage.  Functional abnormalities, such as when the small hairs (cilia) that line your sinuses and help remove mucus do not work properly or are not present. SYMPTOMS  Symptoms of acute and chronic sinusitis are the same. The primary symptoms are pain and pressure around the affected sinuses. Other symptoms include:  Upper toothache.  Earache.  Headache.  Bad breath.  Decreased sense of smell and taste.  A cough, which worsens when you are lying flat.  Fatigue.  Fever.  Thick drainage from your nose, which often is green and may contain pus (purulent).  Swelling and warmth over the affected sinuses. DIAGNOSIS  Your caregiver will perform a physical exam. During the exam, your caregiver may:  Look in your nose for signs of abnormal  growths in your nostrils (nasal polyps).  Tap over the affected sinus to check for signs of infection.  View the inside of your sinuses (endoscopy) with a special imaging device with a light attached (endoscope), which is inserted into your sinuses. If your caregiver suspects that you have chronic sinusitis, one or more of the following tests may be recommended:  Allergy tests.  Nasal culture A sample of mucus is taken from your nose and sent to a lab and screened for bacteria.  Nasal cytology A sample of mucus is taken from your nose and examined by your caregiver to determine if your sinusitis is related to an allergy. TREATMENT  Most cases of acute sinusitis are related to a viral infection and will resolve on their own within 10 days. Sometimes medicines are prescribed to help relieve symptoms (pain medicine, decongestants, nasal steroid sprays, or saline sprays).  However, for sinusitis related to a bacterial infection, your caregiver will prescribe antibiotic medicines. These are medicines that will help kill the bacteria causing the infection.  Rarely, sinusitis is caused by a fungal infection. In theses cases, your caregiver will prescribe antifungal medicine. For some cases of chronic sinusitis, surgery is needed. Generally, these are cases in which sinusitis recurs more than 3 times per year, despite other treatments. HOME CARE INSTRUCTIONS   Drink plenty of water. Water helps thin the mucus so your sinuses can drain more easily.  Use a humidifier.  Inhale steam 3 to 4 times a day (for example, sit in the bathroom with the shower running).  Apply a warm, moist washcloth to your face  3 to 4 times a day, or as directed by your caregiver.  Use saline nasal sprays to help moisten and clean your sinuses.  Take over-the-counter or prescription medicines for pain, discomfort, or fever only as directed by your caregiver. SEEK IMMEDIATE MEDICAL CARE IF:  You have increasing pain or  severe headaches.  You have nausea, vomiting, or drowsiness.  You have swelling around your face.  You have vision problems.  You have a stiff neck.  You have difficulty breathing. MAKE SURE YOU:   Understand these instructions.  Will watch your condition.  Will get help right away if you are not doing well or get worse. Document Released: 07/01/2005 Document Revised: 09/23/2011 Document Reviewed: 07/16/2011 ExitCare Patient Information 2014 [employer].   

## 2016-11-14 ENCOUNTER — Ambulatory Visit: Payer: 59 | Admitting: Nurse Practitioner

## 2016-12-05 ENCOUNTER — Ambulatory Visit (INDEPENDENT_AMBULATORY_CARE_PROVIDER_SITE_OTHER): Payer: 59 | Admitting: Nurse Practitioner

## 2016-12-05 ENCOUNTER — Encounter: Payer: Self-pay | Admitting: Nurse Practitioner

## 2016-12-05 VITALS — BP 136/87 | HR 72 | Wt 204.6 lb

## 2016-12-05 DIAGNOSIS — G8929 Other chronic pain: Secondary | ICD-10-CM | POA: Diagnosis not present

## 2016-12-05 DIAGNOSIS — M545 Low back pain, unspecified: Secondary | ICD-10-CM

## 2016-12-05 DIAGNOSIS — M542 Cervicalgia: Secondary | ICD-10-CM | POA: Insufficient documentation

## 2016-12-05 NOTE — Progress Notes (Signed)
I have read the note, and I agree with the clinical assessment and plan.  WILLIS,CHARLES KEITH   

## 2016-12-05 NOTE — Progress Notes (Signed)
[facility]  PATIENT: Courtney Roberson DOB: 21-Dec-1960   REASON FOR VISIT: Follow-up for back pain HISTORY FROM: Patient    HISTORY OF PRESENT ILLNESS:Ms Courtney Roberson, 56 -year-old female returns for followup. She continues to have intermittent low back pain and neck pain. . She is currently taking Aleve, only taking it on a when necessary for back pain. She is continuing her yoga. 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 Cervical spine MRI February 2016 without acute abnormality but degenerative disc disease at C5-C6. She is trying to lose weight and be active. She is doing specific back exercises which are helpful.She is stretching. She feels her back pain is stable. She is moving to [place] so this will be her last visit with [place] .   HISTORY: of low back pain that dates back approximately one year. The patient indicates that she has some pain in the back, more to the left side than the right. The pain will go down the legs to the feet at times, and the patient will also note some numbness that goes down to the feet as well. The patient does not have actual weakness of the legs. The patient denies any pain in the neck or shoulders. The patient has not had any balance issues, and she denies any falls. The patient denies problems controlling the bowels or the bladder. The patient indicates that if she is in one position too long such as sitting, standing, or lying down, she will have increased back pain. The patient feels better if she changes positions frequently. The patient has been trying to lose weight and be active, and she has noted that her back pain worsened when she was using the elliptical machine. The patient has undergone MRI evaluation of the low back that shows some facet joint arthritis at the L5-S1 level, but there is no evidence of nerve root compression at any level. The patient is sent to this office for further  evaluation. The patient previously was placed on a prednisone taper, and she gained good improvement with the low back pain on this regimen    REVIEW OF SYSTEMS: Full 14 system review of systems performed and notable only for those listed, all others are neg:  Constitutional: neg  Cardiovascular: neg Ear/Nose/Throat: neg  Skin: neg Eyes: neg Respiratory: neg Gastroitestinal: neg  Hematology/Lymphatic: neg  Endocrine: neg Musculoskeletal: Intermittent back pain, neck pain Allergy/Immunology: Environmental allergies Neurological: neg Psychiatric: neg Sleep : neg   ALLERGIES: Allergies  Allergen Reactions  . Betadine [Povidone Iodine] Swelling  . Penicillins Rash    HOME MEDICATIONS: Outpatient Medications Prior to Visit  Medication Sig Dispense Refill  . aspirin 81 MG tablet Take 81 mg by mouth daily. Take 2 a day    . B Complex Vitamins (B COMPLEX PO) Take 1 tablet by mouth daily.     . Calcium Carbonate-Vitamin D (CALTRATE 600+D) 600-400 MG-UNIT per chew tablet Chew 1 tablet by mouth daily.    . cetirizine (ZYRTEC) 10 MG tablet Take 10 mg by mouth daily.    . cholecalciferol (VITAMIN D) 1000 UNITS tablet Take 1,000 Units by mouth daily.    . Coenzyme Q10 100 MG capsule Take 100 mg by mouth daily.    . Cyanocobalamin (VITAMIN B12 PO) Take by mouth.    . dorzolamide-timolol (COSOPT) 22.3-6.8 MG/ML ophthalmic solution Place 1 drop into both eyes 2 (two) times daily.     . fluticasone (  FLONASE) 50 MCG/ACT nasal spray Place 2 sprays into both nostrils daily. 16 g 6  . latanoprost (XALATAN) 0.005 % ophthalmic solution Place 1 drop into both eyes at bedtime.  6  . Magnesium Oxide 250 MG TABS Take 250 mg by mouth daily.    . Multiple Vitamins-Minerals (MULTIVITAMIN PO) Take by mouth daily.    . Omega-3 Fatty Acids (FISH OIL) 1000 MG CAPS Take 1,000 mg by mouth daily.    . Probiotic Product (PROBIOTIC PO) Take by mouth as needed.     . vitamin C (ASCORBIC ACID) 500 MG tablet Take  500 mg by mouth as needed.     . benzonatate (TESSALON) 100 MG capsule Take 1 capsule (100 mg total) by mouth 2 (two) times daily as needed for cough. (Patient not taking: Reported on 12/05/2016) 20 capsule 0  . doxycycline (VIBRAMYCIN) 100 MG capsule Take 1 capsule (100 mg total) by mouth 2 (two) times daily. (Patient not taking: Reported on 12/05/2016) 20 capsule 0   No facility-administered medications prior to visit.     PAST MEDICAL HISTORY: Past Medical History:  Diagnosis Date  . Anemia   . Arthritis of back   . Endometriosis   . Fibroid   . Glaucoma    bilateral  . 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 Lumbago   . Thyroid nodule     PAST SURGICAL HISTORY: Past Surgical History:  Procedure Laterality Date  . ABDOMINAL HYSTERECTOMY  9/12   TLH  . BREAST SURGERY     biopsy benign  . CARPAL TUNNEL RELEASE     both hands  . ROTATOR CUFF REPAIR Right 8/09  . TUBAL LIGATION  1993    FAMILY HISTORY: Family History  Problem Relation Age of Onset  . Heart disease Mother   . Hypertension Mother   . Diabetes Mother   . Cancer Father        lung  . Breast cancer Sister   . Heart disease Brother   . Diabetes Brother   . Cancer Maternal Grandmother        ovarian  . Heart disease Maternal Grandfather     SOCIAL HISTORY: Social History   Social History  . Marital status: Married    Spouse name: Jeneen Rinks   . Number of children: 3  . Years of education: 66   Occupational History  . Not on file.   Social History Main Topics  . Smoking status: Never Smoker  . Smokeless tobacco: Never Used  . Alcohol use 0.6 oz/week    1 Glasses of wine per week  . Drug use: No  . Sexual activity: Yes    Partners: Male    Birth control/ protection: Surgical     Comment: TLH   Other Topics Concern  . Not on file   Social History Narrative   Patient is married Jeneen Rinks) and lives at home with her spouse.   Patient has three children.   Patient is retired.   Patient has  an Associate's Degree.   Patient is right- handed.   Patient drinks tea- one cup a day, once a week for coffee and sodas once in a while.     PHYSICAL EXAM  Vitals:   12/05/16 0819  BP: 136/87  Pulse: 72  Weight: 204 lb 9.6 oz (92.8 kg)   Body mass index is 33.02 kg/m. Generalized: Well developed,  obese female in no acute distress  Head: normocephalic and atraumatic,. Oropharynx benign  Neck: Supple, no  carotid bruits  Musculoskeletal: No deformity   Neurological examination   Mentation: Alert oriented to time, place, history taking. Attention span and concentration appropriate. Recent and remote memory intact. Follows all commands speech and language fluent.   Cranial nerve II-XII: Pupils were equal round reactive to light extraocular movements were full, visual field were full on confrontational test. Facial sensation and strength were normal. hearing was intact to finger rubbing bilaterally. Uvula tongue midline. head turning and shoulder shrug were normal and symmetric.Tongue protrusion into cheek strength was normal. Motor: normal bulk and tone, full strength in the BUE, BLE, fine finger movements normal, no pronator drift. No focal weakness Sensory: normal and symmetric to light touch, pinprick, and Vibration, in the upper and lower extremities  Coordination: finger-nose-finger, heel-to-shin bilaterally, no dysmetria Reflexes: Brachioradialis 2/2, biceps 2/2, triceps 2/2, patellar 2/2, Achilles 1/1, plantar responses were flexor bilaterally. Gait and Station: Rising up from seated position without assistance, normal stance, moderate stride, good arm swing, smooth turning, able to perform tiptoe, and heel walking without difficulty. Tandem gait is steady   DIAGNOSTIC DATA (LABS, IMAGING, TESTING) - I reviewed patient records, labs, notes, testing and imaging myself where available.  Lab Results  Component Value Date   WBC 5.1 03/13/2016   HGB 12.7 03/13/2016   HCT  37.9 03/13/2016   MCV 86.9 03/13/2016   PLT 253 03/13/2016    Lab Results  Component Value Date   TSH 1.67 03/13/2016      ASSESSMENT AND PLAN 56 y.o. year old female has a past medical history of Lumbago; and Arthritis of back. And neck pain  here to follow-up. Her symptoms are stable.   Continue Back exercises and stretching to perform several times daily Given neck exercises to perform  Continue YOGA Water therapy would also be a good option Check with medical records on the way out No follow up planned moving to [facility]. Dennie Bible, [facility], [facility], APRN  [facility] [address], [place], [place] [address] [phone]

## 2016-12-05 NOTE — Patient Instructions (Addendum)
Continue Back exercises and stretching to perform several times daily Given neck exercises to perform  Continue YOGA Water therapy would also be a good option Check with medical records on the way out No follow up planned moving to [facility].  Neck Exercises Neck exercises can be important for many reasons:  They can help you to improve and maintain flexibility in your neck. This can be especially important as you age.  They can help to make your neck stronger. This can make movement easier.  They can reduce or prevent neck pain.  They may help your upper back. Ask your health care provider which neck exercises would be best for you. Exercises Neck Press  Repeat this exercise 10 times. Do it first thing in the morning and right before bed or as told by your health care provider. 1. Lie on your back on a firm bed or on the floor with a pillow under your head. 2. Use your neck muscles to push your head down on the pillow and straighten your spine. 3. Hold the position as well as you can. Keep your head facing up and your chin tucked. 4. Slowly count to 5 while holding this position. 5. Relax for a few seconds. Then repeat. Isometric Strengthening  Do a full set of these exercises 2 times a day or as told by your health care provider. 1. Sit in a supportive chair and place your hand on your forehead. 2. Push forward with your head and neck while pushing back with your hand. Hold for 10 seconds. 3. Relax. Then repeat the exercise 3 times. 4. Next, do thesequence again, this time putting your hand against the back of your head. Use your head and neck to push backward against the hand pressure. 5. Finally, do the same exercise on either side of your head, pushing sideways against the pressure of your hand. Prone Head Lifts  Repeat this exercise 5 times. Do this 2 times a day or as told by your health care provider. 1. Lie face-down, resting on your elbows so that your chest and upper back are  raised. 2. Start with your head facing downward, near your chest. Position your chin either on or near your chest. 3. Slowly lift your head upward. Lift until you are looking straight ahead. Then continue lifting your head as far back as you can stretch. 4. Hold your head up for 5 seconds. Then slowly lower it to your starting position. Supine Head Lifts  Repeat this exercise 8-10 times. Do this 2 times a day or as told by your health care provider. 1. Lie on your back, bending your knees to point to the ceiling and keeping your feet flat on the floor. 2. Lift your head slowly off the floor, raising your chin toward your chest. 3. Hold for 5 seconds. 4. Relax and repeat. Scapular Retraction  Repeat this exercise 5 times. Do this 2 times a day or as told by your health care provider. 1. Stand with your arms at your sides. Look straight ahead. 2. Slowly pull both shoulders backward and downward until you feel a stretch between your shoulder blades in your upper back. 3. Hold for 10-30 seconds. 4. Relax and repeat. Contact a health care provider if:  Your neck pain or discomfort gets much worse when you do an exercise.  Your neck pain or discomfort does not improve within 2 hours after you exercise. If you have any of these problems, stop exercising right away. Do  not do the exercises again unless your health care provider says that you can. Get help right away if:  You develop sudden, severe neck pain. If this happens, stop exercising right away. Do not do the exercises again unless your health care provider says that you can. Exercises Neck Stretch  Repeat this exercise 3-5 times. 1. Do this exercise while standing or while sitting in a chair. 2. Place your feet flat on the floor, shoulder-width apart. 3. Slowly turn your head to the right. Turn it all the way to the right so you can look over your right shoulder. Do not tilt or tip your head. 4. Hold this position for 10-30  seconds. 5. Slowly turn your head to the left, to look over your left shoulder. 6. Hold this position for 10-30 seconds. Neck Retraction Repeat this exercise 8-10 times. Do this 3-4 times a day or as told by your health care provider. 1. Do this exercise while standing or while sitting in a sturdy chair. 2. Look straight ahead. Do not bend your neck. 3. Use your fingers to push your chin backward. Do not bend your neck for this movement. Continue to face straight ahead. If you are doing the exercise properly, you will feel a slight sensation in your throat and a stretch at the back of your neck. 4. Hold the stretch for 1-2 seconds. Relax and repeat. This information is not intended to replace advice given to you by your health care provider. Make sure you discuss any questions you have with your health care provider. Document Released: 06/12/2015 Document Revised: 12/07/2015 Document Reviewed: 01/09/2015 Elsevier Interactive Patient Education  2017 [employer].

## 2016-12-12 ENCOUNTER — Ambulatory Visit: Payer: 59 | Admitting: Nurse Practitioner

## 2017-01-21 ENCOUNTER — Encounter: Payer: Self-pay | Admitting: Obstetrics & Gynecology

## 2017-04-11 ENCOUNTER — Ambulatory Visit: Payer: 59 | Admitting: Certified Nurse Midwife

## 2019-10-04 ENCOUNTER — Encounter: Payer: Self-pay | Admitting: Certified Nurse Midwife
